# Patient Record
Sex: Female | Born: 1950 | Race: White | Hispanic: No | Smoking: Current every day smoker
Health system: Southern US, Community
[De-identification: ages and names within clinical notes are randomized; demographics above are authoritative.]

## PROBLEM LIST (undated history)

## (undated) DIAGNOSIS — I10 Essential (primary) hypertension: Secondary | ICD-10-CM

## (undated) DIAGNOSIS — K219 Gastro-esophageal reflux disease without esophagitis: Secondary | ICD-10-CM

## (undated) HISTORY — DX: Gastro-esophageal reflux disease without esophagitis: K21.9

## (undated) HISTORY — PX: EYE SURGERY: SHX253

## (undated) HISTORY — DX: Essential (primary) hypertension: I10

---

## 2015-02-01 ENCOUNTER — Ambulatory Visit: Payer: BLUE CROSS/BLUE SHIELD | Admitting: Diagnostic Neuroimaging

## 2015-03-08 ENCOUNTER — Encounter: Payer: Self-pay | Admitting: Physician Assistant

## 2015-03-08 ENCOUNTER — Ambulatory Visit (INDEPENDENT_AMBULATORY_CARE_PROVIDER_SITE_OTHER): Payer: BLUE CROSS/BLUE SHIELD | Admitting: Physician Assistant

## 2015-03-08 VITALS — BP 153/80 | HR 81 | Temp 98.2°F | Resp 16 | Ht 62.0 in | Wt 101.8 lb

## 2015-03-08 DIAGNOSIS — Z1211 Encounter for screening for malignant neoplasm of colon: Secondary | ICD-10-CM

## 2015-03-08 DIAGNOSIS — I10 Essential (primary) hypertension: Secondary | ICD-10-CM | POA: Diagnosis not present

## 2015-03-08 DIAGNOSIS — K2270 Barrett's esophagus without dysplasia: Secondary | ICD-10-CM | POA: Diagnosis not present

## 2015-03-08 DIAGNOSIS — Z1239 Encounter for other screening for malignant neoplasm of breast: Secondary | ICD-10-CM

## 2015-03-08 DIAGNOSIS — Z720 Tobacco use: Secondary | ICD-10-CM

## 2015-03-08 DIAGNOSIS — F17200 Nicotine dependence, unspecified, uncomplicated: Secondary | ICD-10-CM | POA: Insufficient documentation

## 2015-03-08 DIAGNOSIS — F4321 Adjustment disorder with depressed mood: Secondary | ICD-10-CM

## 2015-03-08 MED ORDER — OMEPRAZOLE 40 MG PO CPDR: 40.0000 mg | DELAYED_RELEASE_CAPSULE | Freq: Every day | ORAL | Status: AC

## 2015-03-08 NOTE — Patient Instructions (Signed)
Please come back to see me soon so we can finish the appointment.  We'll do a TDap vaccine, draw labs, order the shingles vaccine, and do an EKG. I've referred you for a mammogram, referred you to the GI doctor, and sent in for your Omeprazole.

## 2015-03-08 NOTE — Progress Notes (Signed)
Subjective:    Patient ID: Sheryl Garrett, female    DOB: [birth year], 64 y.o.   MRN: [id number]  Chief Complaint  Patient presents with  . Establish care   Patient Active Problem List   Diagnosis Date Noted  . Barrett esophagus 03/08/2015  . Tobacco use disorder 03/08/2015  . Essential hypertension 03/08/2015  . Grief 03/08/2015   Prior to Admission medications   Medication Sig Start Date End Date Taking? Authorizing Provider  ALPRAZolam Duanne Moron) 1 MG tablet Take 1 mg by mouth 4 (four) times daily as needed for anxiety.   Yes Historical Provider, MD  amLODipine-benazepril (LOTREL) 5-20 MG per capsule Take 1 capsule by mouth daily.   Yes Historical Provider, MD  sertraline (ZOLOFT) 100 MG tablet Take 100 mg by mouth daily.   Yes Historical Provider, MD  omeprazole (PRILOSEC) 40 MG capsule Take 1 capsule (40 mg total) by mouth daily. 03/08/15   Julieta Gutting, PA   Medications, allergies, past medical history, surgical history, family history, social history and problem list reviewed and updated.  [name]   Originally from [place]. Has 3 grown daughters in [place]. Youngest in jail for heroin possession. 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 He came down ill last year and within 3 months passed from cancer. She is moving to the [place] area as her sister in law is here. Pt has bought a house but is unsure if she will stay here permanently or not. Wants to go back to [place].   PMH:  Hx Barret esophagus. Was having yearly upper endoscopy with GI in [place]. Needs to establish care with GI physician here. Denies current dysphagia, odynophagia, wt loss. Had been on dexilant but insurance is not covering, since then has been taking otc nexium for past couple wks. Mentions she has been getting mild right sided cp episodes approx once week when lying down at night. These last mins and resolve on own. Similar to prior gerd  sx. No radiation. No assoc sob, palps. No exertional cp.  HTN: On Lotrel. Stable for yrs. Stopped taking this several wks ago as her bp had been running low on home cuff. She thinks the cuff is old and not working well though. Denies cp, sob, palps, presyncope, syncope, ha, vision changes.  Grief: Seeing psychiatrist in area. Zoloft qd. States her psych is thinking of increasing this to 1.5 qd. Takes xanax approx bid. Seeing therapist regularly. Goes to grief therapy once week.  Tobacco use: Smoke since teenager. Currently 3/4 ppd. No interest in quitting at this time.   Not seeing a dentist here yet. Does not exercise. Doesn't have much of an appetite since her husband passed.  Last mammo 2 yrs ago, due. Last pap 3 yrs ago. No hx abn paps. Pt declines pap testing today.   Due for tdap vaccine today. Most recent colonoscopy 2-3 yrs ago in [place]. Pt states has hx polyps and usually has scope every 3 yrs. She is due. Denies recent change bowel habits, painful defecation, abd pain.   She is retired from [employer] in [place]. 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   Review of Systems See HPI.     Objective:   Physical Exam  Constitutional: She is oriented to person, place, and time. She appears well-developed and well-nourished.  Non-toxic appearance. She does not have a sickly appearance.  She does not appear ill. No distress.  BP 153/80 mmHg  Pulse 81  Temp(Src) 98.2 F (36.8 C) (Oral)  Resp 16  Ht 5\' 2"  (1.575 m)  Wt 101 lb 12.8 oz (46.176 kg)  BMI 18.61 kg/m2  SpO2 96%   Neurological: She is alert and oriented to person, place, and time.  Psychiatric: She has a normal mood and affect. Her speech is normal and behavior is normal.      Assessment & Plan:   52 yof with above pmh presents here to establish care.   Barrett's esophagus - Plan: omeprazole (PRILOSEC) 40 MG capsule, Ambulatory referral to Gastroenterology --referred to GI, likely due for upper endoscopy --started on  prilosec 40 mg qd for barretts. Pt previously on dexilant but dealing with obtaining insurance coverage now --no alarm sx today  Special screening for malignant neoplasms, colon - Plan: Ambulatory referral to Gastroenterology --referred to GI --no alarm sx  Screening for breast cancer - Plan: MM DIGITAL SCREENING BILATERAL --referred for mammo  Essential hypertension --elevated today --pt states she has not taken her bp med past couple wks though she is not out --encouraged to restart med, pt agreeable  Grief --doing well  --continue seeing psychiatry, counseling, and grief therapy sessions  *We did not get to a CPE today as pt stated she had to go due to her dog being in the car. She is planning to contact the office in next couple days to set up return appt to see me. Will do cpe, order labs (cmp, cbc, lipids, tsh, d, b12, mag), ekg, order bone density scan with pts hx ppi use, give tdap vaccine all at that time.   Julieta Gutting, PA-C Physician Assistant-Certified [facility]  03/08/2015 5:45 PM   Greater than 30 minutes spent with patient, of which greater than 50% was spent discussing recent life changes she has had to go through including her grief over husband's passing.

## 2015-03-25 ENCOUNTER — Ambulatory Visit
Admission: RE | Admit: 2015-03-25 | Discharge: 2015-03-25 | Disposition: A | Discharge status: Home / Self Care | Payer: BLUE CROSS/BLUE SHIELD | Source: Ambulatory Visit | Attending: Physician Assistant | Admitting: Physician Assistant

## 2015-03-25 DIAGNOSIS — Z1239 Encounter for other screening for malignant neoplasm of breast: Secondary | ICD-10-CM

## 2015-03-26 ENCOUNTER — Other Ambulatory Visit: Payer: Self-pay | Admitting: Physician Assistant

## 2015-03-26 ENCOUNTER — Telehealth: Payer: Self-pay | Admitting: Physician Assistant

## 2015-03-26 DIAGNOSIS — R928 Other abnormal and inconclusive findings on diagnostic imaging of breast: Secondary | ICD-10-CM

## 2015-03-26 NOTE — Telephone Encounter (Signed)
Called and spoke with pt regarding calcifications on breast mammogram. Instructed pt that radiology recommended a diagnostic mammogram for further evaluation and that they will be in contact with her to schedule this. All questions answered. Will follow to assure that mammogram gets completed.

## 2015-03-30 ENCOUNTER — Other Ambulatory Visit: Payer: Self-pay | Admitting: Physician Assistant

## 2015-03-30 ENCOUNTER — Ambulatory Visit
Admission: RE | Admit: 2015-03-30 | Discharge: 2015-03-30 | Disposition: A | Discharge status: Home / Self Care | Payer: BLUE CROSS/BLUE SHIELD | Source: Ambulatory Visit | Attending: Physician Assistant | Admitting: Physician Assistant

## 2015-03-30 ENCOUNTER — Ambulatory Visit (INDEPENDENT_AMBULATORY_CARE_PROVIDER_SITE_OTHER): Payer: BLUE CROSS/BLUE SHIELD | Admitting: Physician Assistant

## 2015-03-30 ENCOUNTER — Encounter: Payer: Self-pay | Admitting: Physician Assistant

## 2015-03-30 VITALS — BP 116/86 | HR 68 | Temp 98.1°F | Resp 16 | Ht 61.5 in | Wt 102.0 lb

## 2015-03-30 DIAGNOSIS — Z130 Encounter for screening for diseases of the blood and blood-forming organs and certain disorders involving the immune mechanism: Secondary | ICD-10-CM

## 2015-03-30 DIAGNOSIS — Z23 Encounter for immunization: Secondary | ICD-10-CM

## 2015-03-30 DIAGNOSIS — Z1329 Encounter for screening for other suspected endocrine disorder: Secondary | ICD-10-CM

## 2015-03-30 DIAGNOSIS — Z5181 Encounter for therapeutic drug level monitoring: Secondary | ICD-10-CM

## 2015-03-30 DIAGNOSIS — Z13820 Encounter for screening for osteoporosis: Secondary | ICD-10-CM

## 2015-03-30 DIAGNOSIS — Z1389 Encounter for screening for other disorder: Secondary | ICD-10-CM | POA: Diagnosis not present

## 2015-03-30 DIAGNOSIS — R9431 Abnormal electrocardiogram [ECG] [EKG]: Secondary | ICD-10-CM | POA: Diagnosis not present

## 2015-03-30 DIAGNOSIS — Z13220 Encounter for screening for lipoid disorders: Secondary | ICD-10-CM | POA: Diagnosis not present

## 2015-03-30 DIAGNOSIS — Z0000 Encounter for general adult medical examination without abnormal findings: Secondary | ICD-10-CM

## 2015-03-30 DIAGNOSIS — J309 Allergic rhinitis, unspecified: Secondary | ICD-10-CM

## 2015-03-30 DIAGNOSIS — R928 Other abnormal and inconclusive findings on diagnostic imaging of breast: Secondary | ICD-10-CM

## 2015-03-30 LAB — COMPLETE METABOLIC PANEL WITH GFR
ALBUMIN: 4.3 g/dL (ref 3.5–5.2)
ALT: 17 U/L (ref 0–35)
AST: 21 U/L (ref 0–37)
Alkaline Phosphatase: 72 U/L (ref 39–117)
BUN: 7 mg/dL (ref 6–23)
CALCIUM: 9.4 mg/dL (ref 8.4–10.5)
CHLORIDE: 96 meq/L (ref 96–112)
CO2: 24 meq/L (ref 19–32)
Creat: 0.6 mg/dL (ref 0.50–1.10)
GFR, Est Non African American: >89 mL/min
GLUCOSE: 79 mg/dL (ref 70–99)
POTASSIUM: 4.3 meq/L (ref 3.5–5.3)
SODIUM: 133 meq/L — AB (ref 135–145)
Total Bilirubin: 0.5 mg/dL (ref 0.2–1.2)
Total Protein: 6.6 g/dL (ref 6.0–8.3)

## 2015-03-30 LAB — CBC
HEMATOCRIT: 40.7 % (ref 36.0–46.0)
Hemoglobin: 14.2 g/dL (ref 12.0–15.0)
MCH: 33.3 pg (ref 26.0–34.0)
MCHC: 34.9 g/dL (ref 30.0–36.0)
MCV: 95.3 fL (ref 78.0–100.0)
MPV: 8.6 fL (ref 8.6–12.4)
Platelets: 335 10*3/uL (ref 150–400)
RBC: 4.27 MIL/uL (ref 3.87–5.11)
RDW: 13.2 % (ref 11.5–15.5)
WBC: 8.6 10*3/uL (ref 4.0–10.5)

## 2015-03-30 LAB — MAGNESIUM: Magnesium: 2.2 mg/dL (ref 1.5–2.5)

## 2015-03-30 LAB — LIPID PANEL
Cholesterol: 195 mg/dL (ref 0–200)
HDL: 105 mg/dL (ref >=46)
LDL CALC: 79 mg/dL (ref 0–99)
Total CHOL/HDL Ratio: 1.9 Ratio
Triglycerides: 54 mg/dL (ref <150)
VLDL: 11 mg/dL (ref 0–40)

## 2015-03-30 LAB — VITAMIN B12: VITAMIN B 12: 307 pg/mL (ref 211–911)

## 2015-03-30 LAB — TSH: TSH: 1.064 u[IU]/mL (ref 0.350–4.500)

## 2015-03-30 MED ORDER — FLUTICASONE PROPIONATE 50 MCG/ACT NA SUSP: 2.0000 | Freq: Every day | NASAL | Status: DC

## 2015-03-30 MED ORDER — ZOSTER VACCINE LIVE 19400 UNT/0.65ML ~~LOC~~ SOLR: 0.6500 mL | Freq: Once | SUBCUTANEOUS | Status: DC

## 2015-03-30 NOTE — Progress Notes (Signed)
Subjective:    Patient ID: Sheryl Garrett, female    DOB: Jan 03, 1951, 64 y.o.   MRN: [id number]  Chief Complaint  Patient presents with  . Annual Exam  . Sinusitis   Patient Active Problem List   Diagnosis Date Noted  . Barrett esophagus 03/08/2015  . Tobacco use disorder 03/08/2015  . Essential hypertension 03/08/2015  . Grief 03/08/2015   Prior to Admission medications   Medication Sig Start Date End Date Taking? Authorizing Provider  ALPRAZolam Duanne Moron) 1 MG tablet Take 1 mg by mouth 4 (four) times daily as needed for anxiety.   Yes Historical Provider, MD  amLODipine-benazepril (LOTREL) 5-20 MG per capsule Take 1 capsule by mouth daily.   Yes Historical Provider, MD  omeprazole (PRILOSEC) 40 MG capsule Take 1 capsule (40 mg total) by mouth daily. 03/08/15  Yes Merlinda Frederick Mihail Prettyman, PA  sertraline (ZOLOFT) 100 MG tablet Take 100 mg by mouth daily.   Yes Historical Provider, MD  fluticasone (FLONASE) 50 MCG/ACT nasal spray Place 2 sprays into both nostrils daily. 03/30/15   Julieta Gutting, PA  zoster vaccine live, PF, (ZOSTAVAX) 40981 UNT/0.65ML injection Inject 19,400 Units into the skin once. 03/30/15   Julieta Gutting, PA   Medications, allergies, past medical history, surgical history, family history, social history and problem list reviewed and updated.  HPI  35 yof with above pmh presents for cpe.   She is new to the area and was seen 03/08/15 in clinic for initial eval. Was not able to get to the physical exam as spent significant time discussing social hx and current life situation along with pmh.   Today she presents for cpe. Much of pmh, social hx, surgical hx reviewed at 3/28 appt.   Vaccinations: Due for both tdap and pneumovax today as she is a smoker. Interested in shingles vaccine. She is not interested in quitting.  Pap: Last one 3 yrs ago, never had abn pap. She declines today.  Screenings: Due for bone density scan as she is on chronic ppi therapy for her barretts. EKG today  as we do not have any on record.  Dentist: Still has not established care. Planning to soon.   She had a concerning screening mammo last week. Had diagnostic mammo this am which was normal. Calcifications in right breast felt to be benign, repeat in 6 months.   She is scheduled to see GI on 04/09/15 for upper endoscopy for barretts and colonoscopy as she had polyps 3 yrs ago. Taking omeprazole daily.   She mentions itchy/watery eyes past few wks. Rhinorrhea past few wks. Took benadryl last night otherwise nothing.   Review of Systems Denies fevers, chills, cp, sob, palps, presyncope, syncope, abd pain, n/v, diarrhea. Denies dysuria, hematuria, blood in stool.     Objective:   Physical Exam  Constitutional: She is oriented to person, place, and time. She appears well-developed and well-nourished.  Non-toxic appearance. She does not have a sickly appearance. She does not appear ill. No distress.  BP 116/86 mmHg  Pulse 68  Temp(Src) 98.1 F (36.7 C) (Oral)  Resp 16  Ht 5' 1.5" (1.562 m)  Wt 102 lb (46.267 kg)  BMI 18.96 kg/m2  SpO2 97%   HENT:  Right Ear: Tympanic membrane normal.  Left Ear: Tympanic membrane normal.  Nose: Nose normal. Right sinus exhibits no maxillary sinus tenderness and no frontal sinus tenderness. Left sinus exhibits no maxillary sinus tenderness and no frontal sinus tenderness.  Mouth/Throat: Uvula is midline,  oropharynx is clear and moist and mucous membranes are normal.  Eyes: EOM are normal. Pupils are equal, round, and reactive to light. Right conjunctiva is injected. Left conjunctiva is not injected.  Neck: Trachea normal and normal range of motion. No JVD present. Carotid bruit is not present. No thyroid mass and no thyromegaly present.  Cardiovascular: Normal rate, regular rhythm and normal heart sounds.   Pulses:      Posterior tibial pulses are 2+ on the right side, and 2+ on the left side.  Pulmonary/Chest: Effort normal and breath sounds normal.    Abdominal: Soft. Normal appearance and bowel sounds are normal. There is no tenderness. There is no rigidity, no rebound, no guarding, no CVA tenderness, no tenderness at McBurney's point and negative Murphy's sign.  Musculoskeletal: Normal range of motion.  Lymphadenopathy:       Head (right side): No submental, no submandibular and no tonsillar adenopathy present.       Head (left side): No submental, no submandibular and no tonsillar adenopathy present.    She has no cervical adenopathy.  Neurological: She is alert and oriented to person, place, and time. She has normal strength. No cranial nerve deficit or sensory deficit. She displays a negative Romberg sign.  Psychiatric: She has a normal mood and affect. Her speech is normal and behavior is normal.   EKG read by Dr. Everlene Farrier. Findings: QS pattern V1, V2 with 1 mm horizontal ST elevation V2. Possible septal injury.      Assessment & Plan:   57 yof with above pmh presents for cpe.   Encounter for annual physical exam - Plan: EKG 12-Lead  Screening for deficiency anemia - Plan: CBC  Screening for hyperlipidemia - Plan: Lipid panel  Screening for thyroid disorder - Plan: TSH  Encounter for therapeutic drug monitoring - Plan: Vitamin D 1,25 dihydroxy, Vitamin B12, Magnesium --monitor with chronic ppi therapy  Screening for nephropathy - Plan: COMPLETE METABOLIC PANEL WITH GFR  Need for prophylactic vaccination against Streptococcus pneumoniae (pneumococcus) - Plan: Pneumococcal polysaccharide vaccine 23-valent greater than or equal to 2yo subcutaneous/IM --pneumovax today, prevnar at 65  Need for Tdap vaccination - Plan: Tdap vaccine greater than or equal to 7yo IM  Screening for osteoporosis - Plan: DG Bone Density  Need for shingles vaccine - Plan: zoster vaccine live, PF, (ZOSTAVAX) 35701 UNT/0.65ML injection  Allergic rhinitis, unspecified allergic rhinitis type - Plan: fluticasone (FLONASE) 50 MCG/ACT nasal  spray  Nonspecific abnormal electrocardiogram (ECG) (EKG) - Plan: Ambulatory referral to Cardiology --pt planning to contact old pcp to obtain ekgs so we can have for comparison --denies cardiac sx   Julieta Gutting, PA-C Physician Assistant-Certified [facility]  03/30/2015 6:11 PM

## 2015-03-30 NOTE — Patient Instructions (Signed)
We drew several labs today and I'll be in touch with you with those results.  We referred you for a bone density scan today.  You received the pneumonia and TDap vaccines today.  Your exam was normal today, please plan to get a pap smear sometime this year or next.  Plan to get the shingles vaccine if it is affordable.  I've referred you to cardiology for the EKG changes, they'll be in contact with you to schedule. Let us know if you get access to your old EKGs for comparison.  For your allergies using flonase daily and taking a daily antihistamine like claritin should help.

## 2015-04-04 LAB — VITAMIN D 1,25 DIHYDROXY
VITAMIN D3 1, 25 (OH): 65 pg/mL
Vitamin D 1, 25 (OH)2 Total: 65 pg/mL (ref 18–72)
Vitamin D2 1, 25 (OH)2: <8 pg/mL

## 2015-05-25 ENCOUNTER — Encounter: Payer: Self-pay | Admitting: Physician Assistant

## 2015-05-25 DIAGNOSIS — D126 Benign neoplasm of colon, unspecified: Secondary | ICD-10-CM | POA: Insufficient documentation

## 2015-05-25 DIAGNOSIS — K219 Gastro-esophageal reflux disease without esophagitis: Secondary | ICD-10-CM | POA: Insufficient documentation

## 2015-05-25 DIAGNOSIS — K5730 Diverticulosis of large intestine without perforation or abscess without bleeding: Secondary | ICD-10-CM | POA: Insufficient documentation

## 2015-06-15 ENCOUNTER — Ambulatory Visit (INDEPENDENT_AMBULATORY_CARE_PROVIDER_SITE_OTHER): Payer: BLUE CROSS/BLUE SHIELD

## 2015-06-15 ENCOUNTER — Ambulatory Visit (INDEPENDENT_AMBULATORY_CARE_PROVIDER_SITE_OTHER): Payer: BLUE CROSS/BLUE SHIELD | Admitting: Family Medicine

## 2015-06-15 VITALS — BP 110/70 | HR 79 | Temp 98.3°F | Resp 16 | Ht 61.5 in | Wt 103.0 lb

## 2015-06-15 DIAGNOSIS — S6992XA Unspecified injury of left wrist, hand and finger(s), initial encounter: Secondary | ICD-10-CM

## 2015-06-15 DIAGNOSIS — W19XXXA Unspecified fall, initial encounter: Secondary | ICD-10-CM

## 2015-06-15 DIAGNOSIS — Y92009 Unspecified place in unspecified non-institutional (private) residence as the place of occurrence of the external cause: Secondary | ICD-10-CM

## 2015-06-15 DIAGNOSIS — S52532A Colles' fracture of left radius, initial encounter for closed fracture: Secondary | ICD-10-CM

## 2015-06-15 MED ORDER — HYDROCODONE-ACETAMINOPHEN 5-325 MG PO TABS: 1.0000 | ORAL_TABLET | Freq: Four times a day (QID) | ORAL | Status: DC | PRN

## 2015-06-15 NOTE — Patient Instructions (Addendum)
Ice and elevated your wrist as much as possible.  You may take of the sling whenever you are being still resting at home and overnight but wear the sling whenever you are up and about and definitely whenever you go out of the house.  DO NOT REMOVE THE SPLINT AT ALL.  If the ace wrap gets really dirty you may change it but at this point I would encourage you not to - we can change it when we see you back and you should not remove the splint at all.  If it is rubbing a sore, if your fingers become swollen, if you have decreased sensation or fingers change color, return to clinic immediately for further evaluation. Recheck in 3-5 days so we can repeat xray and decide whether we want to leave you in the splint or transition to a short arm cast. Move your shoulder and your fingers as much as you can so they do not get stiff but do not move you wrist or elbow. Hopefully you will be taken out of the splint/cast around 4 weeks at the earliest and start physical therapy but may have to stay immobilized for up to 6 weeks if not healing well.  We will likely see you every 2 weeks - you may stop at checkout or call ahead to make sure the PA or myself that you want to see if working when you want to come in.  Hopefully you will be healed and back to normal in 8 weeks.  Colles Fracture A Colles fracture is a type of broken wrist. It means the radius bone is broken (fractured).  The radius is the bone of your forearm on the thumb side. The forearm is the part of your arm between the elbow and your wrist. Your forearm is made up of two bones. These are the radius and ulna. Often when the radius is broken, the ulna may also be broken. A cast or splint is used to protect and keep your injured bone from moving. The cast or splint will be on generally for about 5 to 6 weeks. SYMPTOMS  The usual problems are pain, swelling, and bruising. DIAGNOSIS  The diagnosis of this injury is usually made with X-rays. TREATMENT    Generally the fracture is held in place with a cast until it is healed. In a healthy person the casting lasts about 4 to 6 weeks but will depend on age and other factors. HOME CARE INSTRUCTIONS   Keep the injured part elevated while sitting or lying down. Keep the injury above the level of your heart (the center of the chest). This will decrease swelling and pain.  Apply ice to the injury for 15-20 minutes, 03-04 times per day while awake, for 2 days. Put the ice in a plastic bag and place a thin towel between the bag of ice and your cast or splint.  If you have a plaster or fiberglass cast:  Do not try to scratch the skin under the cast using sharp or pointed objects.  Check the skin around the cast every day. You may put lotion on any red or sore areas.  Keep your cast dry and clean.  If you have a plaster splint:  Wear the splint as directed.  You may loosen the elastic around the splint if your fingers become numb, tingle, or turn cold or blue.  Do not put pressure on any part of your cast or splint. It may break. Rest your cast only  on a pillow the first 24 hours until it is fully hardened.  Your cast or splint can be protected during bathing with a plastic bag. Do not lower the cast or splint into water.  Only take over-the-counter or prescription medicines for pain, discomfort, or fever as directed by your caregiver. SEEK IMMEDIATE MEDICAL CARE IF:   Your cast gets damaged or breaks.  You have more severe pain or swelling than you did before the cast.  Your skin or nails below the injury turn blue or gray, or feel cold or numb.  There is a bad smell or new stains and/or pus like (purulent) drainage coming from under the cast. MAKE SURE YOU:   Understand these instructions.  Will watch your condition.  Will get help right away if you are not doing well or get worse. Document Released: 12/13/2006 Document Revised: 02/19/2012 Document Reviewed: 01/08/2007 [facility]  Patient Information 2015 [place], [place]. This information is not intended to replace advice given to you by your health care provider. Make sure you discuss any questions you have with your health care provider.

## 2015-06-15 NOTE — Progress Notes (Signed)
Subjective:    Patient ID: Sheryl Garrett, female    DOB: 1951-04-08, 64 y.o.   MRN: [id number] This chart was scribed for Delman Cheadle, MD by Zola Button, Medical Scribe. This patient was seen in Room 9 and the patient's care was started at 10:22 AM.   Chief Complaint  Patient presents with  . Wrist Injury    Left wrist due to fall   HPI HPI Comments: Sheryl Garrett is a 64 y.o. female who presents to the [facility] complaining of sudden onset left wrist pain secondary to a fall on outstretched hand last night. Patient states she slipped on a slippery floor while wearing flip flops. She has also noticed some tingling in some of her fingers, primarily in her thumb. She has been applying ice and elevating. She has not applied a wrap nor tried any medications for the pain. Patient denies prior injuries to her wrist. She moved here less than a year ago and has not seen an orthopedist here.  Patient will be flying out tomorrow for a family reunion in [place] and [place]; she will be gone for 1 week.  Past Medical History  Diagnosis Date  . Hypertension   . GERD (gastroesophageal reflux disease)    History reviewed. No pertinent past surgical history. Current Outpatient Prescriptions on File Prior to Visit  Medication Sig Dispense Refill  . ALPRAZolam (XANAX) 1 MG tablet Take 1 mg by mouth 4 (four) times daily as needed for anxiety.    . fluticasone (FLONASE) 50 MCG/ACT nasal spray Place 2 sprays into both nostrils daily. 16 g 12  . omeprazole (PRILOSEC) 40 MG capsule Take 1 capsule (40 mg total) by mouth daily. 30 capsule 2  . sertraline (ZOLOFT) 100 MG tablet Take 100 mg by mouth daily.    Marland Kitchen amLODipine-benazepril (LOTREL) 5-20 MG per capsule Take 1 capsule by mouth daily.    Marland Kitchen zoster vaccine live, PF, (ZOSTAVAX) 28315 UNT/0.65ML injection Inject 19,400 Units into the skin once. (Patient not taking: Reported on 06/15/2015) 1 each 0   No current facility-administered medications  on file prior to visit.   No Known Allergies Family History  Problem Relation Age of Onset  . Drug abuse Daughter    History   Social History  . Marital Status: Widowed    Spouse Name: N/A  . Number of Children: N/A  . Years of Education: N/A   Social History Main Topics  . Smoking status: Current Every Day Smoker -- 1.00 packs/day  . Smokeless tobacco: Not on file  . Alcohol Use: No  . Drug Use: No  . Sexual Activity: No   Other Topics Concern  . None   Social History Narrative    Review of Systems  Constitutional: Positive for activity change. Negative for fever, chills and unexpected weight change.  Musculoskeletal: Positive for myalgias, joint swelling and arthralgias. Negative for back pain and gait problem.  Skin: Positive for color change. Negative for rash.  Neurological: Negative for weakness and numbness.       Tingling        Objective:  BP 110/70 mmHg  Pulse 79  Temp(Src) 98.3 F (36.8 C) (Oral)  Resp 16  Ht 5' 1.5" (1.562 m)  Wt 103 lb (46.72 kg)  BMI 19.15 kg/m2  SpO2 97%  Physical Exam  Constitutional: She is oriented to person, place, and time. She appears well-developed and well-nourished. No distress.  HENT:  Head: Normocephalic and atraumatic.  Mouth/Throat: Oropharynx is clear and moist. No oropharyngeal exudate.  Eyes: Pupils are equal, round, and reactive to light.  Neck: Neck supple.  Cardiovascular: Normal rate.   Pulses:      Radial pulses are 2+ on the right side, and 2+ on the left side.  2+ ulnar pulses. Normal cap refill.  Pulmonary/Chest: Effort normal.  Musculoskeletal: She exhibits no edema.  Supination and pronation severely limited. Full ROM of 2-5 MCPs; decreased 1st MCP. Severe swelling approximately 5 cm proximal and extending through radial aspect of volar aspect of left hand.  Neurological: She is alert and oriented to person, place, and time. No cranial nerve deficit.  Skin: Skin is warm and dry. No rash noted.    Psychiatric: She has a normal mood and affect. Her behavior is normal.  Vitals reviewed.  [facility] (PRIMARY) x-ray report read by Dr. Brigitte Pulse: Left wrist - Subtle buckle fracture on distal radius with avulsion of radial styloid only seen on lateral.  Dg Wrist Complete Left  06/15/2015   CLINICAL DATA:  Status post fall striking the left wrist last night, no previous injury  EXAM: LEFT WRIST - COMPLETE 3+ VIEW  COMPARISON:  None.  FINDINGS: The bones are mildly osteopenic. There is a nondisplaced impacted fracture of the distal left radial metaphysis. The adjacent ulna is intact. The carpal bones are intact. There is narrowing of the radiocarpal joint and of the first carpometacarpal joint. There is diffuse soft tissue swelling.  IMPRESSION: The patient has sustained a nondisplaced impacted fracture of the distal left radial metaphysis.   Electronically Signed   By: David  Martinique M.D.   On: 06/15/2015 10:53        Assessment & Plan:  Patient in a sugar-tong splint and a sling. Re-check in 3-5 days, but if she is doing well with minimal pain and increasing function, OK to defer follow-up after her return to [place] from [place] trip in 1 week. At which point, patient XR can be repeated and be placed in a short-term cast vs. continued splinting for healing. Will see every 2 weeks after and expect immobilization for 4-6 wks and resolution in 6-8 wks.. Encourage motion of the shoulder and fingers. CD given to patient in case patient has increasing pain and needs to see care in [place]. Try to follow-up in 3-5 days. Keep arm elevated and keep active ROM of shoulders and fingers.  1. Left wrist injury, initial encounter   2. Fall as cause of accidental injury in home as place of occurrence   3. Colles' fracture of left radius, closed, initial encounter     Orders Placed This Encounter  Procedures  . DG Wrist Complete Left    Standing Status: Future     Number of Occurrences: 1     Standing Expiration Date:  06/14/2016    Order Specific Question:  Reason for Exam (SYMPTOM  OR DIAGNOSIS REQUIRED)    Answer:  foosh yesterday, severe swelling and deformity over radial aspect    Order Specific Question:  Preferred imaging location?    Answer:  External    Meds ordered this encounter  Medications  . HYDROcodone-acetaminophen (NORCO/VICODIN) 5-325 MG per tablet    Sig: Take 1 tablet by mouth every 6 (six) hours as needed for moderate pain.    Dispense:  60 tablet    Refill:  0    I personally performed the services described in this documentation, which was scribed in my presence. The recorded information has  been reviewed and considered, and addended by me as needed.  Delman Cheadle, MD MPH

## 2015-06-27 ENCOUNTER — Ambulatory Visit (INDEPENDENT_AMBULATORY_CARE_PROVIDER_SITE_OTHER): Payer: BLUE CROSS/BLUE SHIELD | Admitting: Family Medicine

## 2015-06-27 ENCOUNTER — Ambulatory Visit (INDEPENDENT_AMBULATORY_CARE_PROVIDER_SITE_OTHER): Payer: BLUE CROSS/BLUE SHIELD

## 2015-06-27 VITALS — BP 130/80 | HR 62 | Temp 98.0°F | Ht 61.5 in | Wt 109.2 lb

## 2015-06-27 DIAGNOSIS — S52532D Colles' fracture of left radius, subsequent encounter for closed fracture with routine healing: Secondary | ICD-10-CM

## 2015-06-27 NOTE — Patient Instructions (Signed)
We will leave the cast on for 2-4 weeks (probably 4 but we will repeat the xray at your visit in 2 weeks to ensure it is still healing well and that the cast has kept it in alignment.  Stop using the sling.  Make sure you are rotating your shoulder as much as you can.  You will be healed in another 4 to 6 weeks so after your cast comes off we will probably put you in a prefabricated removable splint and refer you to physical therapy so that you can regain full movement in your wrist.  Colles Fracture A Colles fracture is a type of broken wrist. It means the radius bone is broken (fractured).  The radius is the bone of your forearm on the thumb side. The forearm is the part of your arm between the elbow and your wrist. Your forearm is made up of two bones. These are the radius and ulna. Often when the radius is broken, the ulna may also be broken. A cast or splint is used to protect and keep your injured bone from moving. The cast or splint will be on generally for about 5 to 6 weeks. SYMPTOMS  The usual problems are pain, swelling, and bruising. DIAGNOSIS  The diagnosis of this injury is usually made with X-rays. TREATMENT  Generally the fracture is held in place with a cast until it is healed. In a healthy person the casting lasts about 4 to 6 weeks but will depend on age and other factors. HOME CARE INSTRUCTIONS   Keep the injured part elevated while sitting or lying down. Keep the injury above the level of your heart (the center of the chest). This will decrease swelling and pain.  Apply ice to the injury for 15-20 minutes, 03-04 times per day while awake, for 2 days. Put the ice in a plastic bag and place a thin towel between the bag of ice and your cast or splint.  If you have a plaster or fiberglass cast:  Do not try to scratch the skin under the cast using sharp or pointed objects.  Check the skin around the cast every day. You may put lotion on any red or sore areas.  Keep your cast  dry and clean.  If you have a plaster splint:  Wear the splint as directed.  You may loosen the elastic around the splint if your fingers become numb, tingle, or turn cold or blue.  Do not put pressure on any part of your cast or splint. It may break. Rest your cast only on a pillow the first 24 hours until it is fully hardened.  Your cast or splint can be protected during bathing with a plastic bag. Do not lower the cast or splint into water.  Only take over-the-counter or prescription medicines for pain, discomfort, or fever as directed by your caregiver. SEEK IMMEDIATE MEDICAL CARE IF:   Your cast gets damaged or breaks.  You have more severe pain or swelling than you did before the cast.  Your skin or nails below the injury turn blue or gray, or feel cold or numb.  There is a bad smell or new stains and/or pus like (purulent) drainage coming from under the cast. MAKE SURE YOU:   Understand these instructions.  Will watch your condition.  Will get help right away if you are not doing well or get worse. Document Released: 12/13/2006 Document Revised: 02/19/2012 Document Reviewed: 01/08/2007 [facility] Patient Information 2015 [place], [place]. This information  is not intended to replace advice given to you by your health care provider. Make sure you discuss any questions you have with your health care provider.  

## 2015-06-27 NOTE — Progress Notes (Addendum)
Subjective:  This chart was scribed for Delman Cheadle, MD by Thea Alken, ED Scribe. This patient was seen in room 12 and the patient's care was started at 10:29 AM   Patient ID: Sheryl Garrett, female    DOB: 1951-12-11, 64 y.o.   MRN: [id number]  HPI   Chief Complaint  Patient presents with  . Follow-up    recheck left wrist from fall (repeat xray)   HPI Comments: Sheryl Garrett is a 64 y.o. female who presents to the [facility] for a follow up regarding an impacted fracture to left radius. Patient  [place] resulting in colles fracture on July 4th. She had planned to fly to [place] the following day. She was placed in sugar tong splint with sling and was advised to f/u in 3-5 days if she did not go on vacation. Encouraged patient movement of shoulder and finger as well as RICE.  Pt went on her trip to [place] but had difficulty getting around. Patient is still having pain to left wrist. She is tolerating Norco and has been taking 1 twice a day. No pain in shoulder and has been doing shoulder movements as advised previously.   Past Medical History  Diagnosis Date  . Hypertension   . GERD (gastroesophageal reflux disease)    No past surgical history on file. Prior to Admission medications   Medication Sig Start Date End Date Taking? Authorizing Provider  ALPRAZolam Duanne Moron) 1 MG tablet Take 1 mg by mouth 4 (four) times daily as needed for anxiety.   Yes Historical Provider, MD  amLODipine-benazepril (LOTREL) 5-20 MG per capsule Take 1 capsule by mouth daily.   Yes Historical Provider, MD  fluticasone (FLONASE) 50 MCG/ACT nasal spray Place 2 sprays into both nostrils daily. 03/30/15  Yes Todd McVeigh, PA  HYDROcodone-acetaminophen (NORCO/VICODIN) 5-325 MG per tablet Take 1 tablet by mouth every 6 (six) hours as needed for moderate pain. 06/15/15  Yes Shawnee Knapp, MD  omeprazole (PRILOSEC) 40 MG capsule Take 1 capsule (40 mg total) by mouth daily. 03/08/15  Yes Todd McVeigh, PA    sertraline (ZOLOFT) 100 MG tablet Take 100 mg by mouth daily.   Yes Historical Provider, MD  zoster vaccine live, PF, (ZOSTAVAX) 78938 UNT/0.65ML injection Inject 19,400 Units into the skin once. Patient not taking: Reported on 06/15/2015 03/30/15   Araceli Bouche, PA   Review of Systems  Constitutional: Positive for activity change. Negative for fever, chills, appetite change and unexpected weight change.  Cardiovascular: Negative for chest pain and palpitations.  Gastrointestinal: Negative for vomiting and abdominal pain.  Musculoskeletal: Positive for myalgias, joint swelling and arthralgias. Negative for gait problem.  Skin: Positive for color change. Negative for rash and wound.  Neurological: Negative for tremors, weakness and numbness.  Hematological: Bruises/bleeds easily.  Psychiatric/Behavioral: Positive for sleep disturbance.   Objective:   Physical Exam  Constitutional: She is oriented to person, place, and time. She appears well-developed and well-nourished. No distress.  HENT:  Head: Normocephalic and atraumatic.  Eyes: Conjunctivae and EOM are normal.  Neck: Neck supple.  Cardiovascular: Normal rate.   Pulmonary/Chest: Effort normal.  Musculoskeletal: Normal range of motion.  Moderate reduction in supination and pronation with severe reduction extension and flexion to about 30 degrees. No extension.  Full flexion of elbow. Elbow extension mildly reduced to about 30 degrees. Full grasp but painful with resistance. Hematoma and bruising over ulnar aspect of left arm.   Neurological: She is alert and  oriented to person, place, and time.  Skin: Skin is warm and dry.  Psychiatric: She has a normal mood and affect. Her behavior is normal.  Nursing note and vitals reviewed.  Filed Vitals:   06/27/15 0937  BP: 130/80  Pulse: 62  Temp: 98 F (36.7 C)  TempSrc: Oral  Height: 5' 1.5" (1.562 m)  Weight: 109 lb 4 oz (49.555 kg)  SpO2: 97%    [facility] reading (PRIMARY) by Dr. Brigitte Pulse.  Left wrist. left distal radial fracture healing with signs of callus formation.  Dg Forearm Left  06/27/2015   CLINICAL DATA:  Evaluate healing Colles fracture  EXAM: LEFT FOREARM - 2 VIEW  COMPARISON:  06/15/2015  FINDINGS: Healing distal radial fracture without intra-articular extension. Associated callus formation. Fracture lucency remains visible.  IMPRESSION: Healing distal radial fracture, as above. Fracture lucency remains visible.   Electronically Signed   By: Julian Hy M.D.   On: 06/27/2015 11:45   Dg Wrist Complete Left  06/15/2015   CLINICAL DATA:  Status post fall striking the left wrist last night, no previous injury  EXAM: LEFT WRIST - COMPLETE 3+ VIEW  COMPARISON:  None.  FINDINGS: The bones are mildly osteopenic. There is a nondisplaced impacted fracture of the distal left radial metaphysis. The adjacent ulna is intact. The carpal bones are intact. There is narrowing of the radiocarpal joint and of the first carpometacarpal joint. There is diffuse soft tissue swelling.  IMPRESSION: The patient has sustained a nondisplaced impacted fracture of the distal left radial metaphysis.   Electronically Signed   By: David  Martinique M.D.   On: 06/15/2015 10:53    Assessment & Plan:    1. Colles' fracture of left radius, closed, with routine healing, subsequent encounter   short arm cast applied today for 2-4 wks - expect 4 wks due to age and tob abuse.  Fast track recheck in 2 wks for repeat xray and can remove if large amount of callus formation and may want to cons transitioning pt into pre-fab cock-up wrist splint for pt to use at least during activity as usually will take 4-6 additional wks from today (2 wks in) for complete healing.  Stop sling, push shoulder and finger ROM exercises, start elbow ROM as tolerated. Will refer to PT for aggressive ROM/restrengthening after cast removed.  Orders Placed This Encounter  Procedures  . DG Forearm Left    Standing Status: Future     Number  of Occurrences: 1     Standing Expiration Date: 06/26/2016    Order Specific Question:  Reason for Exam (SYMPTOM  OR DIAGNOSIS REQUIRED)    Answer:  f/u radial impaction fracture from 7/4    Order Specific Question:  Preferred imaging location?    Answer:  External     I personally performed the services described in this documentation, which was scribed in my presence. The recorded information has been reviewed and considered, and addended by me as needed.  Delman Cheadle, MD MPH

## 2015-06-27 NOTE — Progress Notes (Signed)
Short arm cast applied.

## 2015-07-09 ENCOUNTER — Ambulatory Visit (INDEPENDENT_AMBULATORY_CARE_PROVIDER_SITE_OTHER): Payer: BLUE CROSS/BLUE SHIELD

## 2015-07-09 ENCOUNTER — Ambulatory Visit (INDEPENDENT_AMBULATORY_CARE_PROVIDER_SITE_OTHER): Payer: BLUE CROSS/BLUE SHIELD | Admitting: Emergency Medicine

## 2015-07-09 VITALS — BP 130/80 | HR 71 | Temp 98.0°F | Resp 16 | Ht 61.5 in | Wt 107.0 lb

## 2015-07-09 DIAGNOSIS — S52502D Unspecified fracture of the lower end of left radius, subsequent encounter for closed fracture with routine healing: Secondary | ICD-10-CM

## 2015-07-09 NOTE — Patient Instructions (Signed)
Radial Fracture °You have a broken bone (fracture) of the forearm. This is the part of your arm between the elbow and your wrist. Your forearm is made up of two bones. These are the radius and ulna. Your fracture is in the radial shaft. This is the bone in your forearm located on the thumb side. A cast or splint is used to protect and keep your injured bone from moving. The cast or splint will be on generally for about 5 to 6 weeks, with individual variations. °HOME CARE INSTRUCTIONS  °· Keep the injured part elevated while sitting or lying down. Keep the injury above the level of your heart (the center of the chest). This will decrease swelling and pain. °· Apply ice to the injury for 15-20 minutes, 03-04 times per day while awake, for 2 days. Put the ice in a plastic bag and place a towel between the bag of ice and your cast or splint. °· Move your fingers to avoid stiffness and minimize swelling. °· If you have a plaster or fiberglass cast: °¨ Do not try to scratch the skin under the cast using sharp or pointed objects. °¨ Check the skin around the cast every day. You may put lotion on any red or sore areas. °¨ Keep your cast dry and clean. °· If you have a plaster splint: °¨ Wear the splint as directed. °¨ You may loosen the elastic around the splint if your fingers become numb, tingle, or turn cold or blue. °¨ Do not put pressure on any part of your cast or splint. It may break. Rest your cast only on a pillow for the first 24 hours until it is fully hardened. °· Your cast or splint can be protected during bathing with a plastic bag. Do not lower the cast or splint into water. °· Only take over-the-counter or prescription medicines for pain, discomfort, or fever as directed by your caregiver. °SEEK IMMEDIATE MEDICAL CARE IF:  °· Your cast gets damaged or breaks. °· You have more severe pain or swelling than you did before getting the cast. °· You have severe pain when stretching your fingers. °· There is a bad  smell, new stains and/or pus-like (purulent) drainage coming from under the cast. °· Your fingers or hand turn pale or blue and become cold or your loose feeling. °Document Released: 05/10/2006 Document Revised: 02/19/2012 Document Reviewed: 08/06/2006 °[employer]® Patient Information ©2015 [employer]. This information is not intended to replace advice given to you by your health care provider. Make sure you discuss any questions you have with your health care provider. ° °

## 2015-07-09 NOTE — Progress Notes (Addendum)
Subjective:  Patient ID: Sheryl Garrett, female    DOB: Sep 05, 1951  Age: 64 y.o. MRN: [id number]  CC: Follow-up and Arm Injury   HPI Sheryl Garrett presents  for follow-up of fracture distal radius. She's been doing well in the interval history and has little pain. She does have some discomfort with flexion of the of the thumb she doesn't have an opposable grip right now. She was scheduled to come back in today for follow-up x-ray.  History Sheryl Garrett has a past medical history of Hypertension and GERD (gastroesophageal reflux disease).   She has no past surgical history on file.   Her  family history includes Drug abuse in her daughter.  She   reports that she has been smoking.  She does not have any smokeless tobacco history on file. She reports that she does not drink alcohol or use illicit drugs.  Outpatient Prescriptions Prior to Visit  Medication Sig Dispense Refill  . ALPRAZolam (XANAX) 1 MG tablet Take 1 mg by mouth 4 (four) times daily as needed for anxiety.    Marland Kitchen amLODipine-benazepril (LOTREL) 5-20 MG per capsule Take 1 capsule by mouth daily.    . fluticasone (FLONASE) 50 MCG/ACT nasal spray Place 2 sprays into both nostrils daily. 16 g 12  . HYDROcodone-acetaminophen (NORCO/VICODIN) 5-325 MG per tablet Take 1 tablet by mouth every 6 (six) hours as needed for moderate pain. 60 tablet 0  . omeprazole (PRILOSEC) 40 MG capsule Take 1 capsule (40 mg total) by mouth daily. 30 capsule 2  . sertraline (ZOLOFT) 100 MG tablet Take 100 mg by mouth daily.    Marland Kitchen zoster vaccine live, PF, (ZOSTAVAX) 28786 UNT/0.65ML injection Inject 19,400 Units into the skin once. 1 each 0   No facility-administered medications prior to visit.    History   Social History  . Marital Status: Widowed    Spouse Name: N/A  . Number of Children: N/A  . Years of Education: N/A   Social History Main Topics  . Smoking status: Current Every Day Smoker -- 1.00 packs/day  . Smokeless tobacco: Not on file  . Alcohol  Use: No  . Drug Use: No  . Sexual Activity: No   Other Topics Concern  . None   Social History Narrative     Review of Systems  Constitutional: Negative for fever, chills and appetite change.  HENT: Negative for congestion, ear pain, postnasal drip, sinus pressure and sore throat.   Eyes: Negative for pain and redness.  Respiratory: Negative for cough, shortness of breath and wheezing.   Cardiovascular: Negative for leg swelling.  Gastrointestinal: Negative for nausea, vomiting, abdominal pain, diarrhea, constipation and blood in stool.  Endocrine: Negative for polyuria.  Genitourinary: Negative for dysuria, urgency, frequency and flank pain.  Musculoskeletal: Negative for gait problem.  Skin: Negative for rash.  Neurological: Negative for weakness and headaches.  Psychiatric/Behavioral: Negative for confusion and decreased concentration. The patient is not nervous/anxious.     Objective:  BP 130/80 mmHg  Pulse 71  Temp(Src) 98 F (36.7 C)  Resp 16  Ht 5' 1.5" (1.562 m)  Wt 107 lb (48.535 kg)  BMI 19.89 kg/m2  Physical Exam  Constitutional: She is oriented to person, place, and time. She appears well-developed and well-nourished.  HENT:  Head: Normocephalic and atraumatic.  Eyes: Conjunctivae are normal. Pupils are equal, round, and reactive to light.  Pulmonary/Chest: Effort normal.  Musculoskeletal: She exhibits no edema.  Neurological: She is alert and oriented to person, place, and  time.  Skin: Skin is dry.  Psychiatric: She has a normal mood and affect. Her behavior is normal. Thought content normal.      Assessment & Plan:   Sheryl Garrett was seen today for follow-up and arm injury.  Diagnoses and all orders for this visit:  Distal radius fracture, left, closed, with routine healing, subsequent encounter Orders: -     DG Wrist Complete Left; Future   I am having Sheryl Garrett maintain her amLODipine-benazepril, sertraline, ALPRAZolam, omeprazole, zoster vaccine  live (PF), fluticasone, and HYDROcodone-acetaminophen.  No orders of the defined types were placed in this encounter.   The cast around the base of thumb was trimmed back to allow better flexion of the thumb. She had worn spot on her flexor thumb where the thumb was rubbing against cast   Appropriate red flag conditions were discussed with the patient as well as actions that should be taken.  Patient expressed his understanding.  Follow-up: Return in about 2 weeks (around 07/23/2015).  Sheryl Culver, MD    [facility] reading (PRIMARY) by  Dr. Ouida Garrett.  Healing fracture  Good position.

## 2015-07-22 ENCOUNTER — Ambulatory Visit (INDEPENDENT_AMBULATORY_CARE_PROVIDER_SITE_OTHER): Payer: BLUE CROSS/BLUE SHIELD | Admitting: Family Medicine

## 2015-07-22 ENCOUNTER — Ambulatory Visit (INDEPENDENT_AMBULATORY_CARE_PROVIDER_SITE_OTHER): Payer: BLUE CROSS/BLUE SHIELD

## 2015-07-22 VITALS — BP 104/74 | HR 63 | Temp 97.8°F | Resp 16 | Ht 62.0 in | Wt 106.6 lb

## 2015-07-22 DIAGNOSIS — S52532G Colles' fracture of left radius, subsequent encounter for closed fracture with delayed healing: Secondary | ICD-10-CM

## 2015-07-22 DIAGNOSIS — M25532 Pain in left wrist: Secondary | ICD-10-CM | POA: Diagnosis not present

## 2015-07-22 DIAGNOSIS — S46812A Strain of other muscles, fascia and tendons at shoulder and upper arm level, left arm, initial encounter: Secondary | ICD-10-CM | POA: Diagnosis not present

## 2015-07-22 MED ORDER — HYDROCODONE-ACETAMINOPHEN 5-325 MG PO TABS: 1.0000 | ORAL_TABLET | Freq: Four times a day (QID) | ORAL | Status: DC | PRN

## 2015-07-22 NOTE — Patient Instructions (Addendum)
We are going to leave you in the wrist splint until you are no longer having any pain in your wrist.  My hope is that at your next office visit in 2 weeks (where you can follow up with Colletta Maryland who you met today), that you are having significantly less/minimal pain and then we can take you out of the splint overnight and while resting, and just have you wear the splint while you are active and doing chores.  Listen to your body - if it hurts that means you shouldn't do it.  Your wrist is healing but it is just slower than you or I want but it is happening.  We just need to continue being patient and being nice to your wrist so that it does fully heal without chronic pain or restrictions.  Certainly physical therapy would be best but if that is not an option for you I want you to try some of the exercises at home but gently and slowly.  We want to get the most motion in your other joints, with the least amount of weight and discomfort to your other joints, while still protecting your wrist.  Try not to twist your forearm and use your right hand for doorknobs, jars, etc.  Colles Fracture A Colles fracture is a type of broken wrist. It means the radius bone is broken (fractured).  The radius is the bone of your forearm on the thumb side. The forearm is the part of your arm between the elbow and your wrist. Your forearm is made up of two bones. These are the radius and ulna. Often when the radius is broken, the ulna may also be broken. A cast or splint is used to protect and keep your injured bone from moving. The cast or splint will be on generally for about 5 to 6 weeks. SYMPTOMS  The usual problems are pain, swelling, and bruising. DIAGNOSIS  The diagnosis of this injury is usually made with X-rays. TREATMENT  Generally the fracture is held in place with a cast until it is healed. In a healthy person the casting lasts about 4 to 6 weeks but will depend on age and other factors. HOME CARE INSTRUCTIONS    Keep the injured part elevated while sitting or lying down. Keep the injury above the level of your heart (the center of the chest). This will decrease swelling and pain.  Apply ice to the injury for 15-20 minutes, 03-04 times per day while awake, for 2 days. Put the ice in a plastic bag and place a thin towel between the bag of ice and your cast or splint.  If you have a plaster or fiberglass cast:  Do not try to scratch the skin under the cast using sharp or pointed objects.  Check the skin around the cast every day. You may put lotion on any red or sore areas.  Keep your cast dry and clean.  If you have a plaster splint:  Wear the splint as directed.  You may loosen the elastic around the splint if your fingers become numb, tingle, or turn cold or blue.  Do not put pressure on any part of your cast or splint. It may break. Rest your cast only on a pillow the first 24 hours until it is fully hardened.  Your cast or splint can be protected during bathing with a plastic bag. Do not lower the cast or splint into water.  Only take over-the-counter or prescription medicines for pain, discomfort,  or fever as directed by your caregiver. SEEK IMMEDIATE MEDICAL CARE IF:   Your cast gets damaged or breaks.  You have more severe pain or swelling than you did before the cast.  Your skin or nails below the injury turn blue or gray, or feel cold or numb.  There is a bad smell or new stains and/or pus like (purulent) drainage coming from under the cast. MAKE SURE YOU:   Understand these instructions.  Will watch your condition.  Will get help right away if you are not doing well or get worse. Document Released: 12/13/2006 Document Revised: 02/19/2012 Document Reviewed: 01/08/2007 [facility] Patient Information 2015 [place], [place]. This information is not intended to replace advice given to you by your health care provider. Make sure you discuss any questions you have with your health  care provider.  Impingement Syndrome, Rotator Cuff, Bursitis with Rehab Impingement syndrome is a condition that involves inflammation of the tendons of the rotator cuff and the subacromial bursa, that causes pain in the shoulder. The rotator cuff consists of four tendons and muscles that control much of the shoulder and upper arm function. The subacromial bursa is a fluid filled sac that helps reduce friction between the rotator cuff and one of the bones of the shoulder (acromion). Impingement syndrome is usually an overuse injury that causes swelling of the bursa (bursitis), swelling of the tendon (tendonitis), and/or a tear of the tendon (strain). Strains are classified into three categories. Grade 1 strains cause pain, but the tendon is not lengthened. Grade 2 strains include a lengthened ligament, due to the ligament being stretched or partially ruptured. With grade 2 strains there is still function, although the function may be decreased. Grade 3 strains include a complete tear of the tendon or muscle, and function is usually impaired. SYMPTOMS   Pain around the shoulder, often at the outer portion of the upper arm.  Pain that gets worse with shoulder function, especially when reaching overhead or lifting.  Sometimes, aching when not using the arm.  Pain that wakes you up at night.  Sometimes, tenderness, swelling, warmth, or redness over the affected area.  Loss of strength.  Limited motion of the shoulder, especially reaching behind the back (to the back pocket or to unhook bra) or across your body.  Crackling sound (crepitation) when moving the arm.  Biceps tendon pain and inflammation (in the front of the shoulder). Worse when bending the elbow or lifting. CAUSES  Impingement syndrome is often an overuse injury, in which chronic (repetitive) motions cause the tendons or bursa to become inflamed. A strain occurs when a force is paced on the tendon or muscle that is greater than it  can withstand. Common mechanisms of injury include: Stress from sudden increase in duration, frequency, or intensity of training.  Direct hit (trauma) to the shoulder.  Aging, erosion of the tendon with normal use.  Bony bump on shoulder (acromial spur). RISK INCREASES WITH:  Contact sports (football, wrestling, boxing).  Throwing sports (baseball, tennis, volleyball).  Weightlifting and bodybuilding.  Heavy labor.  Previous injury to the rotator cuff, including impingement.  Poor shoulder strength and flexibility.  Failure to warm up properly before activity.  Inadequate protective equipment.  Old age.  Bony bump on shoulder (acromial spur). PREVENTION   Warm up and stretch properly before activity.  Allow for adequate recovery between workouts.  Maintain physical fitness:  Strength, flexibility, and endurance.  Cardiovascular fitness.  Learn and use proper exercise technique. PROGNOSIS  If treated properly, impingement syndrome usually goes away within 6 weeks. Sometimes surgery is required.  RELATED COMPLICATIONS   Longer healing time if not properly treated, or if not given enough time to heal.  Recurring symptoms, that result in a chronic condition.  Shoulder stiffness, frozen shoulder, or loss of motion.  Rotator cuff tendon tear.  Recurring symptoms, especially if activity is resumed too soon, with overuse, with a direct blow, or when using poor technique. TREATMENT  Treatment first involves the use of ice and medicine, to reduce pain and inflammation. The use of strengthening and stretching exercises may help reduce pain with activity. These exercises may be performed at home or with a therapist. If non-surgical treatment is unsuccessful after more than 6 months, surgery may be advised. After surgery and rehabilitation, activity is usually possible in 3 months.  MEDICATION  If pain medicine is needed, nonsteroidal anti-inflammatory medicines (aspirin  and ibuprofen), or other minor pain relievers (acetaminophen), are often advised.  Do not take pain medicine for 7 days before surgery.  Prescription pain relievers may be given, if your caregiver thinks they are needed. Use only as directed and only as much as you need.  Corticosteroid injections may be given by your caregiver. These injections should be reserved for the most serious cases, because they may only be given a certain number of times. HEAT AND COLD  Cold treatment (icing) should be applied for 10 to 15 minutes every 2 to 3 hours for inflammation and pain, and immediately after activity that aggravates your symptoms. Use ice packs or an ice massage.  Heat treatment may be used before performing stretching and strengthening activities prescribed by your caregiver, physical therapist, or athletic trainer. Use a heat pack or a warm water soak. SEEK MEDICAL CARE IF:   Symptoms get worse or do not improve in 4 to 6 weeks, despite treatment.  New, unexplained symptoms develop. (Drugs used in treatment may produce side effects.) EXERCISES  RANGE OF MOTION (ROM) AND STRETCHING EXERCISES - Impingement Syndrome (Rotator Cuff  Tendinitis, Bursitis) These exercises may help you when beginning to rehabilitate your injury. Your symptoms may go away with or without further involvement from your physician, physical therapist or athletic trainer. While completing these exercises, remember:   Restoring tissue flexibility helps normal motion to return to the joints. This allows healthier, less painful movement and activity.  An effective stretch should be held for at least 30 seconds.  A stretch should never be painful. You should only feel a gentle lengthening or release in the stretched tissue. STRETCH - Flexion, Standing  Stand with good posture. With an underhand grip on your right / left hand, and an overhand grip on the opposite hand, grasp a broomstick or cane so that your hands are a  little more than shoulder width apart.  Keeping your right / left elbow straight and shoulder muscles relaxed, push the stick with your opposite hand, to raise your right / left arm in front of your body and then overhead. Raise your arm until you feel a stretch in your right / left shoulder, but before you have increased shoulder pain.  Try to avoid shrugging your right / left shoulder as your arm rises, by keeping your shoulder blade tucked down and toward your mid-back spine. Hold for __________ seconds.  Slowly return to the starting position. Repeat __________ times. Complete this exercise __________ times per day. STRETCH - Abduction, Supine  Lie on your back. With an underhand grip  on your right / left hand and an overhand grip on the opposite hand, grasp a broomstick or cane so that your hands are a little more than shoulder width apart.  Keeping your right / left elbow straight and your shoulder muscles relaxed, push the stick with your opposite hand, to raise your right / left arm out to the side of your body and then overhead. Raise your arm until you feel a stretch in your right / left shoulder, but before you have increased shoulder pain.  Try to avoid shrugging your right / left shoulder as your arm rises, by keeping your shoulder blade tucked down and toward your mid-back spine. Hold for __________ seconds.  Slowly return to the starting position. Repeat __________ times. Complete this exercise __________ times per day. ROM - Flexion, Active-Assisted  Lie on your back. You may bend your knees for comfort.  Grasp a broomstick or cane so your hands are about shoulder width apart. Your right / left hand should grip the end of the stick, so that your hand is positioned "thumbs-up," as if you were about to shake hands.  Using your healthy arm to lead, raise your right / left arm overhead, until you feel a gentle stretch in your shoulder. Hold for __________ seconds.  Use the stick  to assist in returning your right / left arm to its starting position. Repeat __________ times. Complete this exercise __________ times per day.  ROM - Internal Rotation, Supine   Lie on your back on a firm surface. Place your right / left elbow about 60 degrees away from your side. Elevate your elbow with a folded towel, so that the elbow and shoulder are the same height.  Using a broomstick or cane and your strong arm, pull your right / left hand toward your body until you feel a gentle stretch, but no increase in your shoulder pain. Keep your shoulder and elbow in place throughout the exercise.  Hold for __________ seconds. Slowly return to the starting position. Repeat __________ times. Complete this exercise __________ times per day. STRETCH - Internal Rotation  Place your right / left hand behind your back, palm up.  Throw a towel or belt over your opposite shoulder. Grasp the towel with your right / left hand.  While keeping an upright posture, gently pull up on the towel, until you feel a stretch in the front of your right / left shoulder.  Avoid shrugging your right / left shoulder as your arm rises, by keeping your shoulder blade tucked down and toward your mid-back spine.  Hold for __________ seconds. Release the stretch, by lowering your healthy hand. Repeat __________ times. Complete this exercise __________ times per day. ROM - Internal Rotation   Using an underhand grip, grasp a stick behind your back with both hands.  While standing upright with good posture, slide the stick up your back until you feel a mild stretch in the front of your shoulder.  Hold for __________ seconds. Slowly return to your starting position. Repeat __________ times. Complete this exercise __________ times per day.  STRETCH - Posterior Shoulder Capsule   Stand or sit with good posture. Grasp your right / left elbow and draw it across your chest, keeping it at the same height as your  shoulder.  Pull your elbow, so your upper arm comes in closer to your chest. Pull until you feel a gentle stretch in the back of your shoulder.  Hold for __________ seconds. Repeat __________  times. Complete this exercise __________ times per day. STRENGTHENING EXERCISES - Impingement Syndrome (Rotator Cuff Tendinitis, Bursitis) These exercises may help you when beginning to rehabilitate your injury. They may resolve your symptoms with or without further involvement from your physician, physical therapist or athletic trainer. While completing these exercises, remember:  Muscles can gain both the endurance and the strength needed for everyday activities through controlled exercises.  Complete these exercises as instructed by your physician, physical therapist or athletic trainer. Increase the resistance and repetitions only as guided.  You may experience muscle soreness or fatigue, but the pain or discomfort you are trying to eliminate should never worsen during these exercises. If this pain does get worse, stop and make sure you are following the directions exactly. If the pain is still present after adjustments, discontinue the exercise until you can discuss the trouble with your clinician.  During your recovery, avoid activity or exercises which involve actions that place your injured hand or elbow above your head or behind your back or head. These positions stress the tissues which you are trying to heal. STRENGTH - Scapular Depression and Adduction   With good posture, sit on a firm chair. Support your arms in front of you, with pillows, arm rests, or on a table top. Have your elbows in line with the sides of your body.  Gently draw your shoulder blades down and toward your mid-back spine. Gradually increase the tension, without tensing the muscles along the top of your shoulders and the back of your neck.  Hold for __________ seconds. Slowly release the tension and relax your muscles  completely before starting the next repetition.  After you have practiced this exercise, remove the arm support and complete the exercise in standing as well as sitting position. Repeat __________ times. Complete this exercise __________ times per day.  STRENGTH - Shoulder Abductors, Isometric  With good posture, stand or sit about 4-6 inches from a wall, with your right / left side facing the wall.  Bend your right / left elbow. Gently press your right / left elbow into the wall. Increase the pressure gradually, until you are pressing as hard as you can, without shrugging your shoulder or increasing any shoulder discomfort.  Hold for __________ seconds.  Release the tension slowly. Relax your shoulder muscles completely before you begin the next repetition. Repeat __________ times. Complete this exercise __________ times per day.  STRENGTH - External Rotators, Isometric  Keep your right / left elbow at your side and bend it 90 degrees.  Step into a door frame so that the outside of your right / left wrist can press against the door frame without your upper arm leaving your side.  Gently press your right / left wrist into the door frame, as if you were trying to swing the back of your hand away from your stomach. Gradually increase the tension, until you are pressing as hard as you can, without shrugging your shoulder or increasing any shoulder discomfort.  Hold for __________ seconds.  Release the tension slowly. Relax your shoulder muscles completely before you begin the next repetition. Repeat __________ times. Complete this exercise __________ times per day.  STRENGTH - Supraspinatus   Stand or sit with good posture. Grasp a __________ weight, or an exercise band or tubing, so that your hand is "thumbs-up," like you are shaking hands.  Slowly lift your right / left arm in a "V" away from your thigh, diagonally into the space between your side and  straight ahead. Lift your hand to  shoulder height or as far as you can, without increasing any shoulder pain. At first, many people do not lift their hands above shoulder height.  Avoid shrugging your right / left shoulder as your arm rises, by keeping your shoulder blade tucked down and toward your mid-back spine.  Hold for __________ seconds. Control the descent of your hand, as you slowly return to your starting position. Repeat __________ times. Complete this exercise __________ times per day.  STRENGTH - External Rotators  Secure a rubber exercise band or tubing to a fixed object (table, pole) so that it is at the same height as your right / left elbow when you are standing or sitting on a firm surface.  Stand or sit so that the secured exercise band is at your uninjured side.  Bend your right / left elbow 90 degrees. Place a folded towel or small pillow under your right / left arm, so that your elbow is a few inches away from your side.  Keeping the tension on the exercise band, pull it away from your body, as if pivoting on your elbow. Be sure to keep your body steady, so that the movement is coming only from your rotating shoulder.  Hold for __________ seconds. Release the tension in a controlled manner, as you return to the starting position. Repeat __________ times. Complete this exercise __________ times per day.  STRENGTH - Internal Rotators   Secure a rubber exercise band or tubing to a fixed object (table, pole) so that it is at the same height as your right / left elbow when you are standing or sitting on a firm surface.  Stand or sit so that the secured exercise band is at your right / left side.  Bend your elbow 90 degrees. Place a folded towel or small pillow under your right / left arm so that your elbow is a few inches away from your side.  Keeping the tension on the exercise band, pull it across your body, toward your stomach. Be sure to keep your body steady, so that the movement is coming only from  your rotating shoulder.  Hold for __________ seconds. Release the tension in a controlled manner, as you return to the starting position. Repeat __________ times. Complete this exercise __________ times per day.  STRENGTH - Scapular Protractors, Standing   Stand arms length away from a wall. Place your hands on the wall, keeping your elbows straight.  Begin by dropping your shoulder blades down and toward your mid-back spine.  To strengthen your protractors, keep your shoulder blades down, but slide them forward on your rib cage. It will feel as if you are lifting the back of your rib cage away from the wall. This is a subtle motion and can be challenging to complete. Ask your caregiver for further instruction, if you are not sure you are doing the exercise correctly.  Hold for __________ seconds. Slowly return to the starting position, resting the muscles completely before starting the next repetition. Repeat __________ times. Complete this exercise __________ times per day. STRENGTH - Scapular Protractors, Supine  Lie on your back on a firm surface. Extend your right / left arm straight into the air while holding a __________ weight in your hand.  Keeping your head and back in place, lift your shoulder off the floor.  Hold for __________ seconds. Slowly return to the starting position, and allow your muscles to relax completely before starting the next  repetition. Repeat __________ times. Complete this exercise __________ times per day. STRENGTH - Scapular Protractors, Quadruped  Get onto your hands and knees, with your shoulders directly over your hands (or as close as you can be, comfortably).  Keeping your elbows locked, lift the back of your rib cage up into your shoulder blades, so your mid-back rounds out. Keep your neck muscles relaxed.  Hold this position for __________ seconds. Slowly return to the starting position and allow your muscles to relax completely before starting the  next repetition. Repeat __________ times. Complete this exercise __________ times per day.  STRENGTH - Scapular Retractors  Secure a rubber exercise band or tubing to a fixed object (table, pole), so that it is at the height of your shoulders when you are either standing, or sitting on a firm armless chair.  With a palm down grip, grasp an end of the band in each hand. Straighten your elbows and lift your hands straight in front of you, at shoulder height. Step back, away from the secured end of the band, until it becomes tense.  Squeezing your shoulder blades together, draw your elbows back toward your sides, as you bend them. Keep your upper arms lifted away from your body throughout the exercise.  Hold for __________ seconds. Slowly ease the tension on the band, as you reverse the directions and return to the starting position. Repeat __________ times. Complete this exercise __________ times per day. STRENGTH - Shoulder Extensors   Secure a rubber exercise band or tubing to a fixed object (table, pole) so that it is at the height of your shoulders when you are either standing, or sitting on a firm armless chair.  With a thumbs-up grip, grasp an end of the band in each hand. Straighten your elbows and lift your hands straight in front of you, at shoulder height. Step back, away from the secured end of the band, until it becomes tense.  Squeezing your shoulder blades together, pull your hands down to the sides of your thighs. Do not allow your hands to go behind you.  Hold for __________ seconds. Slowly ease the tension on the band, as you reverse the directions and return to the starting position. Repeat __________ times. Complete this exercise __________ times per day.  STRENGTH - Scapular Retractors and External Rotators   Secure a rubber exercise band or tubing to a fixed object (table, pole) so that it is at the height as your shoulders, when you are either standing, or sitting on a  firm armless chair.  With a palm down grip, grasp an end of the band in each hand. Bend your elbows 90 degrees and lift your elbows to shoulder height, at your sides. Step back, away from the secured end of the band, until it becomes tense.  Squeezing your shoulder blades together, rotate your shoulders so that your upper arms and elbows remain stationary, but your fists travel upward to head height.  Hold for __________ seconds. Slowly ease the tension on the band, as you reverse the directions and return to the starting position. Repeat __________ times. Complete this exercise __________ times per day.  STRENGTH - Scapular Retractors and External Rotators, Rowing   Secure a rubber exercise band or tubing to a fixed object (table, pole) so that it is at the height of your shoulders, when you are either standing, or sitting on a firm armless chair.  With a palm down grip, grasp an end of the band in each hand.  Straighten your elbows and lift your hands straight in front of you, at shoulder height. Step back, away from the secured end of the band, until it becomes tense.  Step 1: Squeeze your shoulder blades together. Bending your elbows, draw your hands to your chest, as if you are rowing a boat. At the end of this motion, your hands and elbow should be at shoulder height and your elbows should be out to your sides.  Step 2: Rotate your shoulders, to raise your hands above your head. Your forearms should be vertical and your upper arms should be horizontal.  Hold for __________ seconds. Slowly ease the tension on the band, as you reverse the directions and return to the starting position. Repeat __________ times. Complete this exercise __________ times per day.  STRENGTH - Scapular Depressors  Find a sturdy chair without wheels, such as a dining room chair.  Keeping your feet on the floor, and your hands on the chair arms, lift your bottom up from the seat, and lock your elbows.  Keeping  your elbows straight, allow gravity to pull your body weight down. Your shoulders will rise toward your ears.  Raise your body against gravity by drawing your shoulder blades down your back, shortening the distance between your shoulders and ears. Although your feet should always maintain contact with the floor, your feet should progressively support less body weight, as you get stronger.  Hold for __________ seconds. In a controlled and slow manner, lower your body weight to begin the next repetition. Repeat __________ times. Complete this exercise __________ times per day.  Document Released: 11/27/2005 Document Revised: 02/19/2012 Document Reviewed: 03/11/2009 [facility] Patient Information 2015 [place], [place]. This information is not intended to replace advice given to you by your health care provider. Make sure you discuss any questions you have with your health care provider.

## 2015-07-22 NOTE — Progress Notes (Addendum)
Subjective:  This chart was scribed for Delman Cheadle, MD by Leandra Kern, Medical Scribe. This patient was seen in Room 4 and the patient's care was started at 11:28 AM.   Patient ID: Sheryl Garrett, female    DOB: September 23, 1951, 64 y.o.   MRN: [id number]  Chief Complaint  Patient presents with  . Follow-up  . left wrist    fracture  . Medication Refill    Hydrocodone-Acetaminophen 5-325 mg   HPI HPI Comments: Sheryl Garrett is a 64 y.o. female who presents to [facility] for a follow up.  Pt is healing form left colles fracture, and she had a short arm cast placed on 7/17. She returned to clinic 2 weeks ago, saw Dr. Ouida Sills, cast was trimmed but left in place, X-rays showed no callus growth, bone was still showing resorption. We know pt will be a slow healer due to age and tobacco use. She is 6 weeks out form injury, first 2 weeks were in a sling and sugar tong splint, so she has been immobilized for 6 weeks now. Pt was encouraged to increase range of motion.   Today, pt notes that the area is still painful when rotating it. She reports that she is having trouble sleeping due to the pain. Pt states that she is out of her Hydrocodone-Acetaminophen medication, therefore she is requesting a refill for that. She denies taking any OTC medications for the pain. She reports that she lives alone and has nobody to help her with different activities that she needs.      Patient Active Problem List   Diagnosis Date Noted  . GERD (gastroesophageal reflux disease) 05/25/2015  . Colon adenomas 05/25/2015  . Diverticulosis of colon without hemorrhage 05/25/2015  . Barrett esophagus 03/08/2015  . Tobacco use disorder 03/08/2015  . Essential hypertension 03/08/2015  . Grief 03/08/2015   Past Medical History  Diagnosis Date  . Hypertension   . GERD (gastroesophageal reflux disease)    No past surgical history on file. No Known Allergies Prior to Admission medications   Medication Sig  Start Date End Date Taking? Authorizing Provider  ALPRAZolam Duanne Moron) 1 MG tablet Take 1 mg by mouth 4 (four) times daily as needed for anxiety.   Yes Historical Provider, MD  fluticasone (FLONASE) 50 MCG/ACT nasal spray Place 2 sprays into both nostrils daily. 03/30/15  Yes Todd McVeigh, PA  HYDROcodone-acetaminophen (NORCO/VICODIN) 5-325 MG per tablet Take 1 tablet by mouth every 6 (six) hours as needed for moderate pain. 06/15/15  Yes Shawnee Knapp, MD  omeprazole (PRILOSEC) 40 MG capsule Take 1 capsule (40 mg total) by mouth daily. 03/08/15  Yes Todd McVeigh, PA  sertraline (ZOLOFT) 100 MG tablet Take 100 mg by mouth daily.   Yes Historical Provider, MD  amLODipine-benazepril (LOTREL) 5-20 MG per capsule Take 1 capsule by mouth daily.    Historical Provider, MD  zoster vaccine live, PF, (ZOSTAVAX) 30076 UNT/0.65ML injection Inject 19,400 Units into the skin once. Patient not taking: Reported on 07/22/2015 03/30/15   Araceli Bouche, PA   Social History   Social History  . Marital Status: Widowed    Spouse Name: N/A  . Number of Children: N/A  . Years of Education: N/A   Occupational History  . Not on file.   Social History Main Topics  . Smoking status: Current Every Day Smoker -- 1.00 packs/day  . Smokeless tobacco: Not on file  . Alcohol Use: No  . Drug Use:  No  . Sexual Activity: No   Other Topics Concern  . Not on file   Social History Narrative    Review of Systems  Musculoskeletal: Positive for arthralgias.  Psychiatric/Behavioral: Positive for sleep disturbance.       Objective:   Physical Exam  Constitutional: She is oriented to person, place, and time. She appears well-developed and well-nourished. No distress.  HENT:  Head: Normocephalic and atraumatic.  Eyes: EOM are normal. Pupils are equal, round, and reactive to light.  Neck: Neck supple.  Cardiovascular: Normal rate.   Pulmonary/Chest: Effort normal.  Musculoskeletal:  Acromioclavicular joint is normal,  coracoid and Acromion is Normal.  Pain localizes to supraspinatus with rotation.  Left shoulder is significantly 2cm lower than the right.   Opposition strength is 5/5  4+/ 5 with internal and external rotation.    Neurological: She is alert and oriented to person, place, and time. No cranial nerve deficit.  Skin: Skin is warm and dry.  Psychiatric: She has a normal mood and affect. Her behavior is normal.  Nursing note and vitals reviewed.  BP 104/74 mmHg  Pulse 63  Temp(Src) 97.8 F (36.6 C) (Oral)  Resp 16  Ht 5\' 2"  (1.575 m)  Wt 106 lb 9.6 oz (48.353 kg)  BMI 19.49 kg/m2  SpO2 98%  [facility] (PRIMARY) x-ray report read by Dr. Delman Cheadle:  Left wrist- Callus formation seen at fracture sight.  Left shoulder- no acute abnormality, minimal degenerative change.   Cast was removed, she still had notable swelling.      Assessment & Plan:   1. Colles' fracture of left radius, closed, with delayed healing, subsequent encounter   2. Strain of left supraspinatus muscle, initial encounter   3. Wrist pain, acute, left   Fracture is healing normally it is just taking a little longer than the pt prefers.  Pt was seen initially the day after injury.  She was placed in a sugar tong splint with a sling with recommendations to stop sling after 3d.  Recommended ortho eval but pt left on vacation and did not pursue this when she was in the [place] visiting family as she was recommended to do.  She returned to clinic 2 wks later and was still having significant tenderness without any sign of healing on xray so short arm cast was placed which she had on x 4 wks as was removed today as we are now seeing the signs of healing on repeat xray.  Pt was transitioned to a prefabricated wrist splint which she is to wear continuously but it is much more comfortable for her.  Cont ROM exercises of shoulder.  Pt reminded to listen to her pain - if an action is causing pain in her wrist - don't do that action. RICE   Recheck in 2 wks - pt will be 2 mos out from injury at that time and my hope is we will be able to start removing the splint when at rest and start gentle ROM exercises.  Will likely need PT.  Will likely need ot repeat left wrist xray again in 2 wks due to pt's sig amount of pain and swelling she is still continuing to have 6 wks out.l  Orders Placed This Encounter  Procedures  . DG Wrist 2 Views Left    Standing Status: Future     Number of Occurrences: 1     Standing Expiration Date: 07/21/2016    Order Specific Question:  Reason for Exam (SYMPTOM  OR DIAGNOSIS REQUIRED)    Answer:  follow up colles fracture 6 weeks prior. casted for past 4 wks    Order Specific Question:  Preferred imaging location?    Answer:  External  . DG Shoulder Left    Standing Status: Future     Number of Occurrences: 1     Standing Expiration Date: 07/21/2016    Order Specific Question:  Reason for Exam (SYMPTOM  OR DIAGNOSIS REQUIRED)    Answer:  increased pain since in short arm cast x 1 mo following 2 wks of sugar tong and splint for left colles fracture    Order Specific Question:  Preferred imaging location?    Answer:  External    Meds ordered this encounter  Medications  . HYDROcodone-acetaminophen (NORCO/VICODIN) 5-325 MG per tablet    Sig: Take 1 tablet by mouth every 6 (six) hours as needed for moderate pain.    Dispense:  60 tablet    Refill:  0    I personally performed the services described in this documentation, which was scribed in my presence. The recorded information has been reviewed and considered, and addended by me as needed.  Delman Cheadle, MD MPH

## 2015-08-11 ENCOUNTER — Institutional Professional Consult (permissible substitution): Payer: BLUE CROSS/BLUE SHIELD | Admitting: Pulmonary Disease

## 2015-08-12 ENCOUNTER — Ambulatory Visit (INDEPENDENT_AMBULATORY_CARE_PROVIDER_SITE_OTHER): Payer: BLUE CROSS/BLUE SHIELD | Admitting: Family Medicine

## 2015-08-12 ENCOUNTER — Ambulatory Visit (INDEPENDENT_AMBULATORY_CARE_PROVIDER_SITE_OTHER): Payer: BLUE CROSS/BLUE SHIELD

## 2015-08-12 ENCOUNTER — Encounter: Payer: Self-pay | Admitting: Family Medicine

## 2015-08-12 VITALS — BP 157/76 | HR 69 | Temp 98.0°F | Resp 16 | Ht 62.0 in | Wt 106.0 lb

## 2015-08-12 DIAGNOSIS — S52532G Colles' fracture of left radius, subsequent encounter for closed fracture with delayed healing: Secondary | ICD-10-CM

## 2015-08-12 DIAGNOSIS — M7502 Adhesive capsulitis of left shoulder: Secondary | ICD-10-CM | POA: Diagnosis not present

## 2015-08-12 MED ORDER — HYDROCODONE-ACETAMINOPHEN 5-325 MG PO TABS: 1.0000 | ORAL_TABLET | Freq: Four times a day (QID) | ORAL | Status: DC | PRN

## 2015-08-12 NOTE — Progress Notes (Signed)
Subjective:    Patient ID: Sheryl Garrett, female    DOB: Jul 09, 1951, 64 y.o.   MRN: [id number]  Chief Complaint  Patient presents with  . Follow-up  . Wrist Injury    HPI  Pt is 8 wks out from injury. Initially placed in sugar tong and sling x 2 wks, did not followup with ortho so then pt placed in short arm cast x 1 mo which was removed 2 wks ago when xray began to show signs of healting.  Placed in prefab splint at all times,  Using prn hydrocodone.  Taking a vitamin B 12 supp - level borderline low when checked 4 mos prior.  Past Medical History  Diagnosis Date  . Hypertension   . GERD (gastroesophageal reflux disease)    Current Outpatient Prescriptions on File Prior to Visit  Medication Sig Dispense Refill  . ALPRAZolam (XANAX) 1 MG tablet Take 1 mg by mouth 4 (four) times daily as needed for anxiety.    Marland Kitchen amLODipine-benazepril (LOTREL) 5-20 MG per capsule Take 1 capsule by mouth daily.    . fluticasone (FLONASE) 50 MCG/ACT nasal spray Place 2 sprays into both nostrils daily. 16 g 12  . omeprazole (PRILOSEC) 40 MG capsule Take 1 capsule (40 mg total) by mouth daily. 30 capsule 2  . sertraline (ZOLOFT) 100 MG tablet Take 100 mg by mouth daily.    Marland Kitchen zoster vaccine live, PF, (ZOSTAVAX) 64680 UNT/0.65ML injection Inject 19,400 Units into the skin once. (Patient not taking: Reported on 07/22/2015) 1 each 0   No current facility-administered medications on file prior to visit.   No Known Allergies  Depression screen [facility] 2/9 06/15/2015 03/08/2015  Decreased Interest 2 0  Down, Depressed, Hopeless 2 0  PHQ - 2 Score 4 0  Altered sleeping 2 -  Tired, decreased energy 2 -  Change in appetite 2 -  Feeling bad or failure about yourself  2 -  Trouble concentrating 2 -  Moving slowly or fidgety/restless 2 -  Suicidal thoughts 0 -  PHQ-9 Score 16 -  Difficult doing work/chores Somewhat difficult -     Review of Systems  Constitutional: Positive for activity change, appetite change  and fatigue. Negative for fever, chills and unexpected weight change.  Musculoskeletal: Positive for myalgias, joint swelling, arthralgias and neck stiffness. Negative for gait problem and neck pain.  Skin: Negative for color change and rash.  Neurological: Negative for weakness and numbness.  Hematological: Bruises/bleeds easily.  Psychiatric/Behavioral: Positive for sleep disturbance, dysphoric mood, decreased concentration and agitation. Negative for suicidal ideas and self-injury.       Objective:   Physical Exam  Constitutional: She is oriented to person, place, and time. She appears well-developed and well-nourished. No distress.  HENT:  Head: Normocephalic and atraumatic.  Right Ear: External ear normal.  Eyes: Conjunctivae are normal. No scleral icterus.  Pulmonary/Chest: Effort normal.  Musculoskeletal:       Left shoulder: She exhibits decreased range of motion, pain and spasm. She exhibits no tenderness, no bony tenderness, no swelling, no effusion, no crepitus, no deformity and normal pulse.       Left elbow: She exhibits decreased range of motion. She exhibits no swelling. No tenderness found.       Left wrist: She exhibits decreased range of motion, tenderness, bony tenderness, swelling and deformity.  Neurological: She is alert and oriented to person, place, and time.  Skin: Skin is warm and dry. She is not diaphoretic. No erythema.  Psychiatric:  Her behavior is normal. Her affect is angry. She exhibits a depressed mood.    [facility] reading (PRIMARY) by  Dr. Brigitte Pulse. Left wrist: Showing some callus formation. No sig change from prior.  Dg Wrist 2 Views Left  08/12/2015   CLINICAL DATA:  Follow-up of collies fracture  EXAM: LEFT WRIST - 2 VIEW  COMPARISON:  In plaster views of July 22, 2015.  FINDINGS: The cast is been removed. There is evidence of healing of the impacted fracture of the distal left radial metaphysis. There is sclerosis surrounding the fracture line. The  fracture line remains faintly visible however. The adjacent ulna exhibits no significant abnormality. There is mild diffuse soft tissue swelling. There are degenerative change of the first carpometacarpal joint.  IMPRESSION: There is ongoing but as yet incomplete healing of the transversely oriented fracture through the distal left radial metaphysis.   Electronically Signed   By: David  Martinique M.D.   On: 08/12/2015 13:00   Dg Wrist 2 Views Left  07/22/2015   CLINICAL DATA:  Followup Colles fracture from 6 weeks prior  EXAM: LEFT WRIST - 2 VIEW  COMPARISON:  Left wrist films of 07/09/2015  FINDINGS: There is slightly more sclerosis along the fracture line through the transverse aspect of the distal left radius indicating some interval healing. No displacement is seen. The radiocarpal joint space is unremarkable and the carpal bones remain in normal position. The left forearm and wrist are in a splint.  IMPRESSION: Some interval healing of the transverse fracture of the distal left radius in splint.   Electronically Signed   By: Ivar Drape M.D.   On: 07/22/2015 12:07   Dg Shoulder Left  07/22/2015   CLINICAL DATA:  Increased pain, history of recent left wrist fracture  EXAM: LEFT SHOULDER - 2+ VIEW  COMPARISON:  None.  FINDINGS: The left humeral head is in normal position and the glenohumeral joint space is unremarkable. The left [facility] joint is normally aligned. No acute abnormality is seen.  IMPRESSION: Negative.   Electronically Signed   By: Ivar Drape M.D.   On: 07/22/2015 12:08       Assessment & Plan:   1. Colles' fracture of left radius, closed, with delayed healing, subsequent encounter   2. Frozen shoulder, left   Refer to PT for both shoulder and wrist.  Still healing - delayed secondary to smoking, petite, caucasion.  Needs dexa scan - has order in but pt has not scheduled. Cont wearing the prefab splint but ok to start removing when awake and sitting resting her hands and when she showers. She  can start doing some gentle ROM exercises.  Ok to wean off splint as guided by PT. RTC if pain/swelling worsens.  Orders Placed This Encounter  Procedures  . DG Wrist 2 Views Left    Standing Status: Future     Number of Occurrences: 1     Standing Expiration Date: 08/11/2016    Order Specific Question:  Reason for Exam (SYMPTOM  OR DIAGNOSIS REQUIRED)    Answer:  f/u colles fracture    Order Specific Question:  Preferred imaging location?    Answer:  External  . Ambulatory referral to Physical Therapy    Referral Priority:  Routine    Referral Type:  Physical Medicine    Referral Reason:  Specialty Services Required    Requested Specialty:  Physical Therapy    Number of Visits Requested:  1    Meds ordered this encounter  Medications  . HYDROcodone-acetaminophen (NORCO/VICODIN) 5-325 MG per tablet    Sig: Take 1 tablet by mouth every 6 (six) hours as needed for moderate pain.    Dispense:  60 tablet    Refill:  0    Delman Cheadle, MD MPH

## 2015-10-08 ENCOUNTER — Ambulatory Visit (INDEPENDENT_AMBULATORY_CARE_PROVIDER_SITE_OTHER): Payer: BLUE CROSS/BLUE SHIELD | Admitting: Family Medicine

## 2015-10-08 VITALS — BP 116/70 | HR 84 | Temp 98.0°F | Resp 17 | Ht 61.5 in | Wt 104.0 lb

## 2015-10-08 DIAGNOSIS — R0981 Nasal congestion: Secondary | ICD-10-CM | POA: Diagnosis not present

## 2015-10-08 DIAGNOSIS — J029 Acute pharyngitis, unspecified: Secondary | ICD-10-CM | POA: Diagnosis not present

## 2015-10-08 MED ORDER — GUAIFENESIN ER 1200 MG PO TB12: 1.0000 | ORAL_TABLET | Freq: Two times a day (BID) | ORAL | Status: DC | PRN

## 2015-10-08 MED ORDER — AMOXICILLIN 875 MG PO TABS: 875.0000 mg | ORAL_TABLET | Freq: Two times a day (BID) | ORAL | Status: DC

## 2015-10-08 MED ORDER — PSEUDOEPHEDRINE HCL ER 120 MG PO TB12: 120.0000 mg | ORAL_TABLET | Freq: Every day | ORAL | Status: DC

## 2015-10-08 MED ORDER — IPRATROPIUM BROMIDE 0.03 % NA SOLN: 2.0000 | Freq: Four times a day (QID) | NASAL | Status: DC

## 2015-10-08 NOTE — Progress Notes (Signed)
Subjective:  This chart was scribed for Sheryl Cheadle, MD by Thea Alken, ED Scribe. This patient was seen in room 10 and the patient's care was started at 8:44 AM.  Patient ID: Sheryl Garrett, female    DOB: 1951-03-12, 64 y.o.   MRN: [id number]  HPI   Chief Complaint  Patient presents with  . Sore Throat  . Otalgia   HPI Comments: Sheryl Garrett is a 64 y.o. female who presents to the [facility] complaining of sore throat and otalgia for 3 days. She has associated sinus pressure, nasal congestion, postnasal drip and hoarseness. She has not treated symptoms. Pt has flonase at home but only uses this once in a while. She will be traveling to [place] within the next week, her daughter is hospitalized for pneumonia.  Denies fever, chills and cough.   Past Medical History  Diagnosis Date  . Hypertension   . GERD (gastroesophageal reflux disease)    No Known Allergies Prior to Admission medications   Medication Sig Start Date End Date Taking? Authorizing Provider  ALPRAZolam Duanne Moron) 1 MG tablet Take 1 mg by mouth 4 (four) times daily as needed for anxiety.   Yes Historical Provider, MD  fluticasone (FLONASE) 50 MCG/ACT nasal spray Place 2 sprays into both nostrils daily. 03/30/15  Yes Todd McVeigh, PA  HYDROcodone-acetaminophen (NORCO/VICODIN) 5-325 MG per tablet Take 1 tablet by mouth every 6 (six) hours as needed for moderate pain. 08/12/15  Yes Shawnee Knapp, MD  omeprazole (PRILOSEC) 40 MG capsule Take 1 capsule (40 mg total) by mouth daily. 03/08/15  Yes Todd McVeigh, PA  sertraline (ZOLOFT) 100 MG tablet Take 100 mg by mouth daily.   Yes Historical Provider, MD  amLODipine-benazepril (LOTREL) 5-20 MG per capsule Take 1 capsule by mouth daily.    Historical Provider, MD  zoster vaccine live, PF, (ZOSTAVAX) 37342 UNT/0.65ML injection Inject 19,400 Units into the skin once. Patient not taking: Reported on 07/22/2015 03/30/15   Araceli Bouche, PA   Review of Systems  Constitutional:  Positive for fatigue. Negative for fever and chills.  HENT: Positive for congestion, ear pain, postnasal drip, sinus pressure, sore throat and trouble swallowing.   Respiratory: Negative for cough and chest tightness.    Objective:   Physical Exam  Constitutional: She appears well-developed and well-nourished. No distress.  HENT:  Head: Normocephalic.  Right Ear: External ear normal. Tympanic membrane is not injected. No middle ear effusion.  Left Ear: External ear normal. Tympanic membrane is not injected.  No middle ear effusion.  Nose: Rhinorrhea present.  Mouth/Throat: Posterior oropharyngeal erythema present. No oropharyngeal exudate or posterior oropharyngeal edema.  Eyes: Conjunctivae are normal.  Neck: Neck supple.  Cardiovascular: Normal rate, regular rhythm, S1 normal, S2 normal and normal heart sounds.  Exam reveals no gallop and no friction rub.   No murmur heard. Pulmonary/Chest: Effort normal and breath sounds normal. She has no wheezes. She has no rales.  Lymphadenopathy:       Head (right side): No tonsillar, no preauricular and no posterior auricular adenopathy present.       Head (left side): No tonsillar, no preauricular and no posterior auricular adenopathy present.    She has cervical adenopathy ( anterior on left).       Right cervical: No superficial cervical adenopathy present.      Left cervical: No superficial cervical adenopathy present.  Neurological: She is alert.  Psychiatric: She has a normal mood and affect. Her behavior  is normal.   Filed Vitals:   10/08/15 0811  BP: 116/70  Pulse: 84  Temp: 98 F (36.7 C)  TempSrc: Oral  Resp: 17  Height: 5' 1.5" (1.562 m)  Weight: 104 lb (47.174 kg)  SpO2: 98%   Assessment & Plan:   1. Sinus congestion   2. Acute pharyngitis, unspecified etiology   Pt reassured that suspect viral so start symptomatic care. However, as pt is traveling out of state next wk and will be around hospitalized sister and ill  father would be best to start emperic antibiotic trx of amox if sxs do not start to resolve in the next few d with symptomatic care alone. Snap rx given Meds ordered this encounter  Medications  . Guaifenesin (MUCINEX MAXIMUM STRENGTH) 1200 MG TB12    Sig: Take 1 tablet (1,200 mg total) by mouth every 12 (twelve) hours as needed.    Dispense:  14 tablet    Refill:  1  . pseudoephedrine (SUDAFED 12 HOUR) 120 MG 12 hr tablet    Sig: Take 1 tablet (120 mg total) by mouth daily.    Dispense:  30 tablet    Refill:  0  . ipratropium (ATROVENT) 0.03 % nasal spray    Sig: Place 2 sprays into the nose 4 (four) times daily.    Dispense:  30 mL    Refill:  1    By signing my name below, I, [name], attest that this documentation has been prepared under the direction and in the presence of Sheryl Cheadle, MD.  Electronically Signed: Thea Alken, ED Scribe. 10/08/2015. 9:00 AM.  I personally performed the services described in this documentation, which was scribed in my presence. The recorded information has been reviewed and considered, and addended by me as needed.  Sheryl Cheadle, MD MPH

## 2015-10-08 NOTE — Patient Instructions (Signed)
Start your Flonase tonight.  Continue this for 2 weeks.  Use the sudafed in the morning along with twice a day mucinex.  Use the atrovent nasal spray every 4 hours. If in 2-3d you are not starting to feel better and getting fevers/chills, blowing out blood or dark mucous, or having sinus pain/pressure over one particular spot then go ahead and start the amoxicillin.  If you are not feeling better by early next week, make sure you come back. Hay Fever Hay fever is an allergic reaction to particles in the air. It cannot be passed from person to person. It cannot be cured, but it can be controlled. CAUSES  Hay fever is caused by something that triggers an allergic reaction (allergens). The following are examples of allergens:  Ragweed.  Feathers.  Animal dander.  Grass and tree pollens.  Cigarette smoke.  House dust.  Pollution. SYMPTOMS   Sneezing.  Runny or stuffy nose.  Tearing eyes.  Itchy eyes, nose, mouth, throat, skin, or other area.  Sore throat.  Headache.  Decreased sense of smell or taste. DIAGNOSIS Your caregiver will perform a physical exam and ask questions about the symptoms you are having.Allergy testing may be done to determine exactly what triggers your hay fever.  TREATMENT   Over-the-counter medicines may help symptoms. These include:  Antihistamines.  Decongestants. These may help with nasal congestion.  Your caregiver may prescribe medicines if over-the-counter medicines do not work.  Some people benefit from allergy shots when other medicines are not helpful. HOME CARE INSTRUCTIONS   Avoid the allergen that is causing your symptoms, if possible.  Take all medicine as told by your caregiver. SEEK MEDICAL CARE IF:   You have severe allergy symptoms and your current medicines are not helping.  Your treatment was working at one time, but you are now experiencing symptoms.  You have sinus congestion and pressure.  You develop a fever or  headache.  You have thick nasal discharge.  You have asthma and have a worsening cough and wheezing. SEEK IMMEDIATE MEDICAL CARE IF:   You have swelling of your tongue or lips.  You have trouble breathing.  You feel lightheaded or like you are going to faint.  You have cold sweats.  You have a fever.   This information is not intended to replace advice given to you by your health care provider. Make sure you discuss any questions you have with your health care provider.   Document Released: 11/27/2005 Document Revised: 02/19/2012 Document Reviewed: 06/09/2015 Elsevier Interactive Patient Education [employer].

## 2015-11-03 IMAGING — CR DG WRIST COMPLETE 3+V*L*
3 series · 3 of 3 positions shown · non-contrast
Comparison: 06/27/2015

CLINICAL DATA: Fracture distal radius left, close, routine healing.
Subsequent encounter.

EXAM:
LEFT WRIST - COMPLETE 3+ VIEW

[PA]
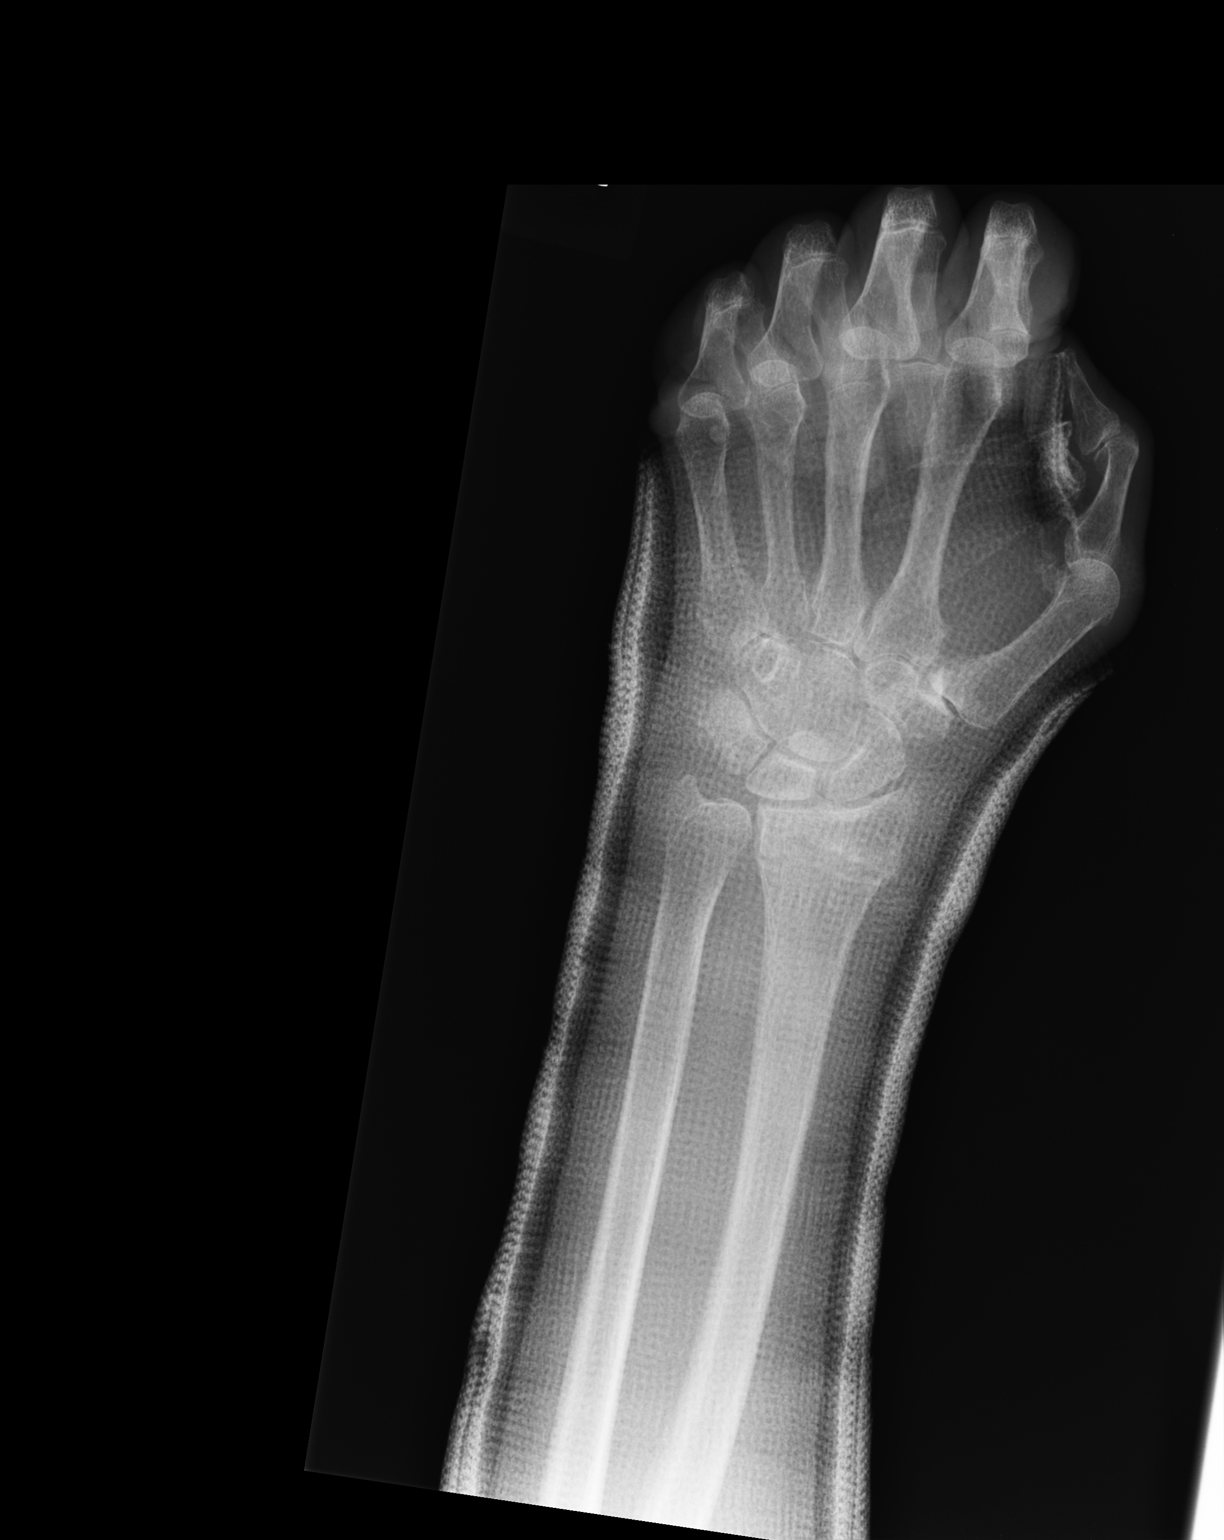

[lateral]
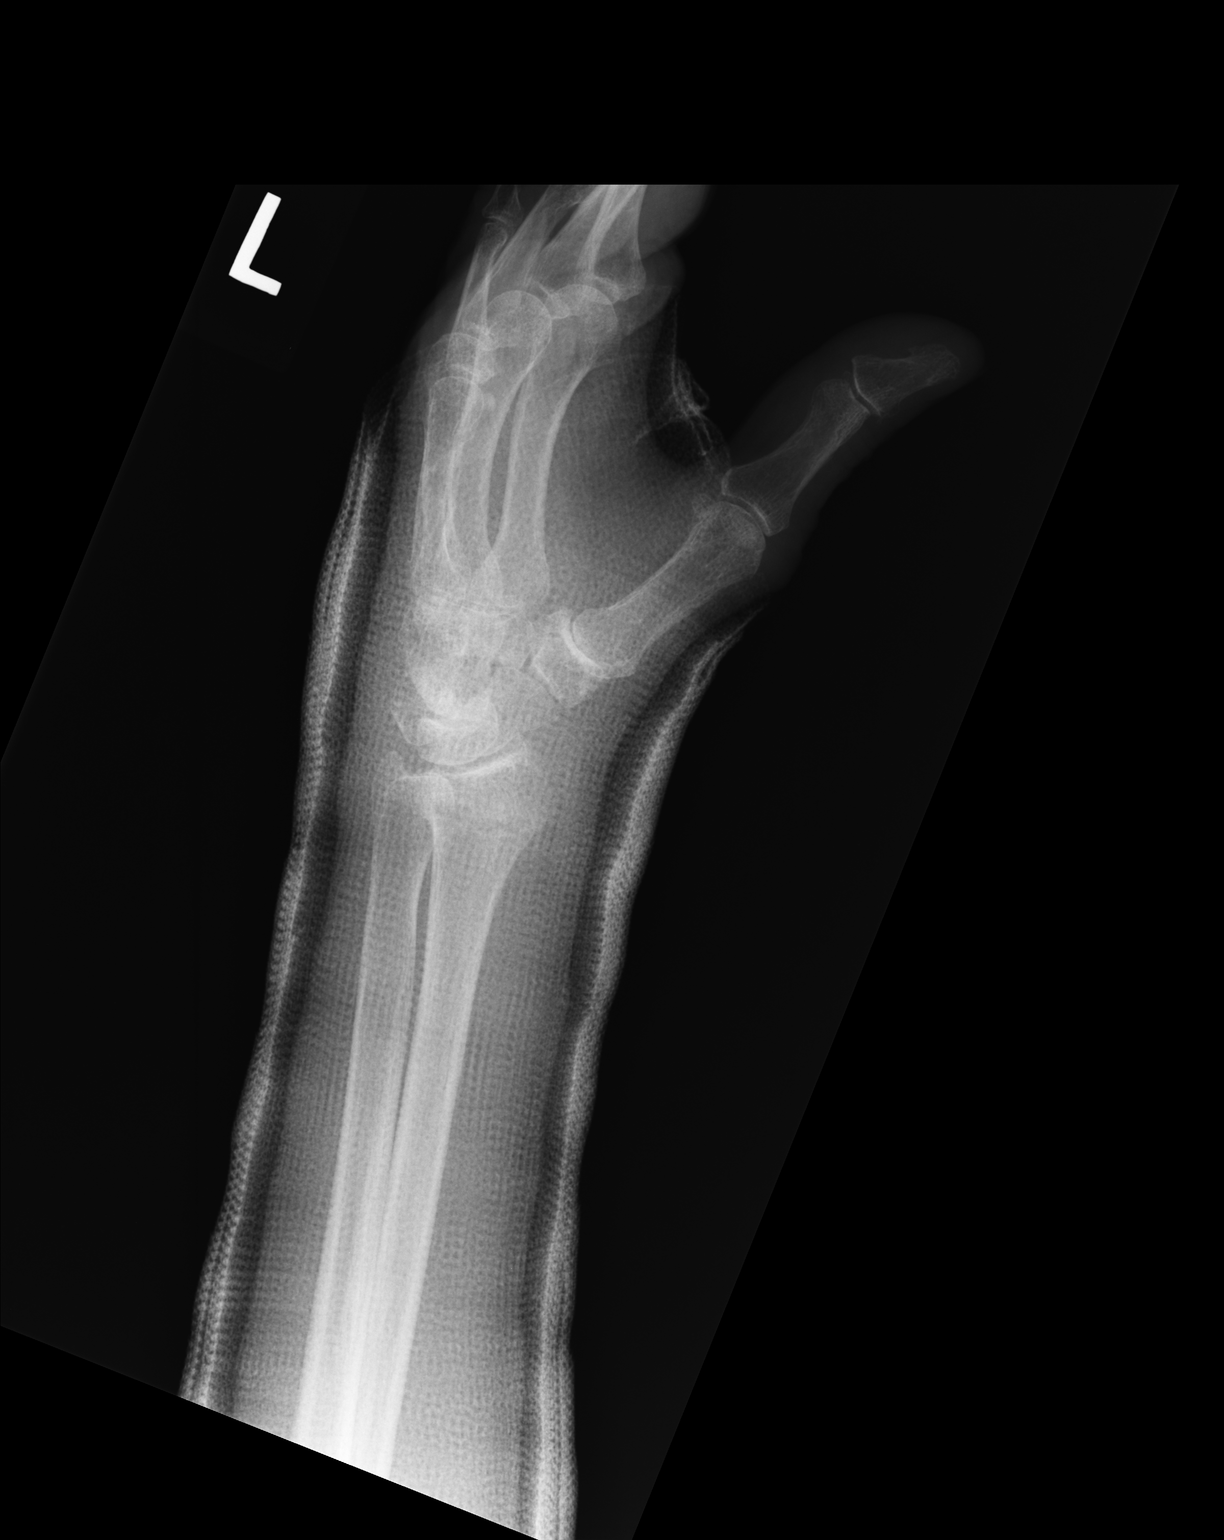

[ap ext rot]
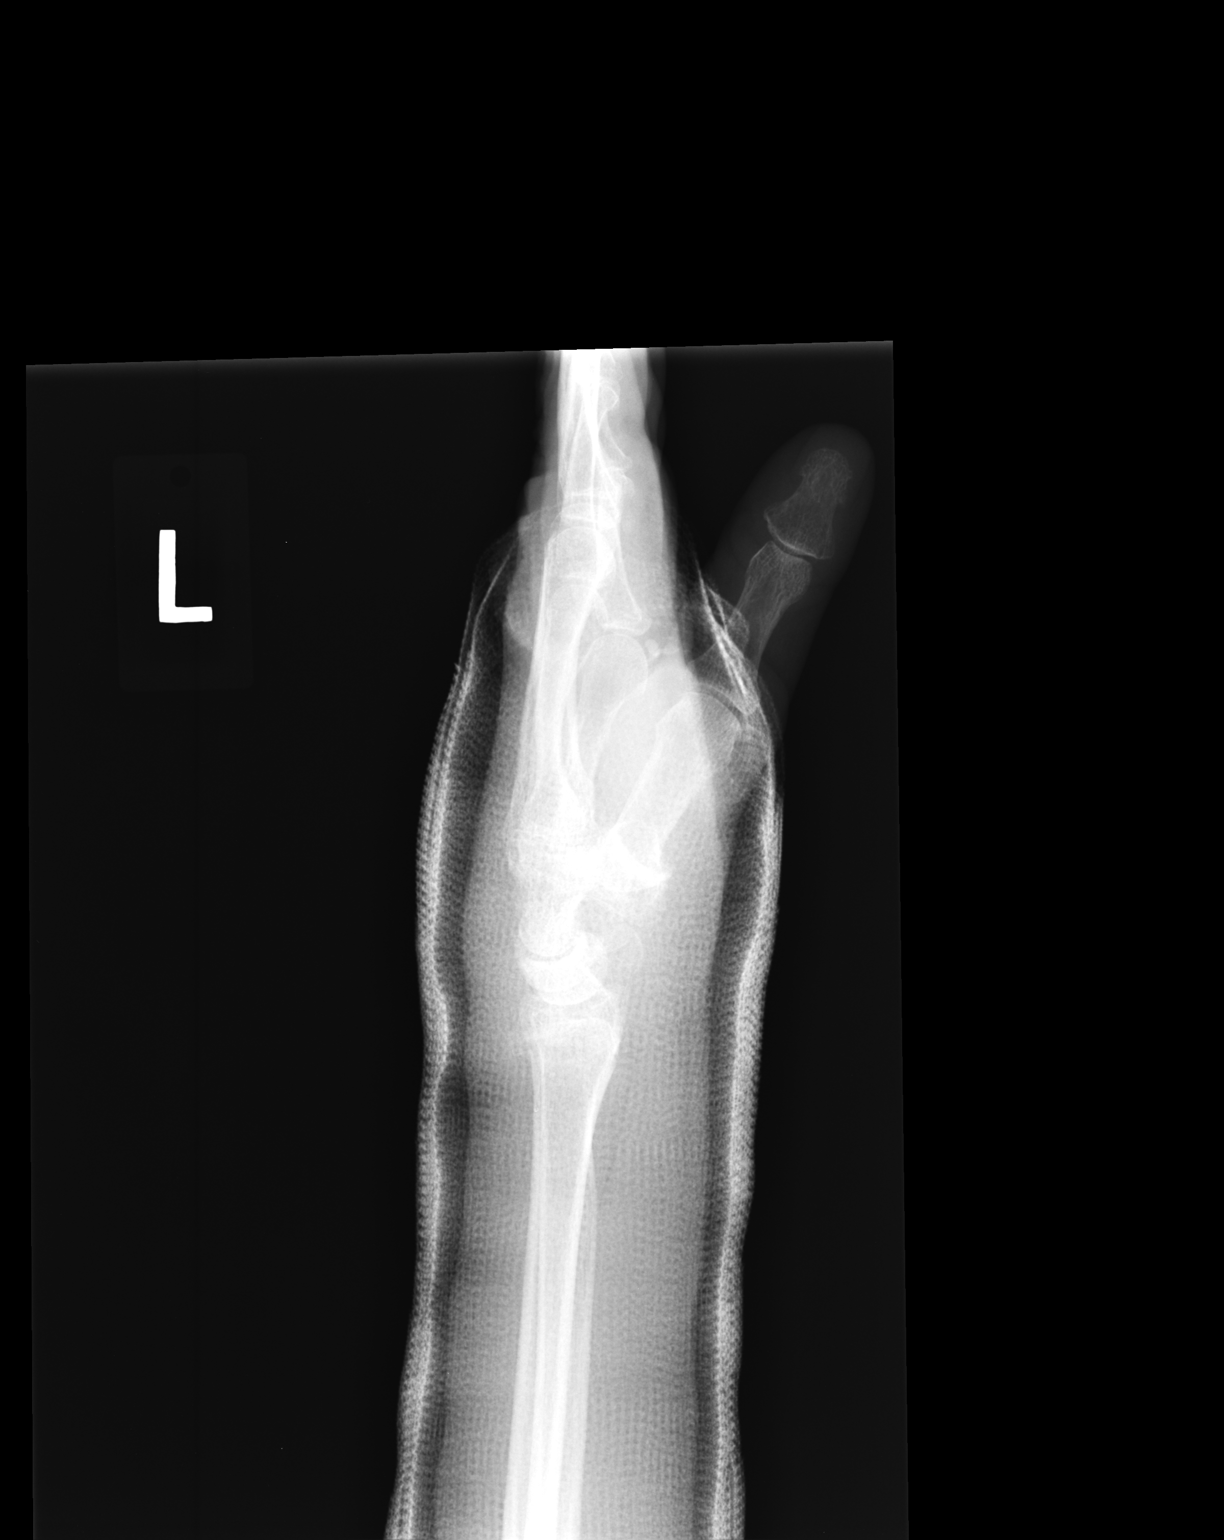

[3 of 3 positions shown; findings below may reference images not displayed]

FINDINGS: Cast is in place obscuring bony detail

Nondisplaced fracture distal radius . Increased lucency at the
fracture site compatible with bone resorption. No displacement. No
new fracture.
IMPRESSION: Distal radial fracture. Increased bone resorption of the fracture
site without displacement. Follow-up images without the cast are
suggested.

## 2015-11-12 ENCOUNTER — Other Ambulatory Visit: Payer: Self-pay | Admitting: Family Medicine

## 2015-11-12 DIAGNOSIS — R921 Mammographic calcification found on diagnostic imaging of breast: Secondary | ICD-10-CM

## 2015-11-16 IMAGING — CR DG WRIST 2V*L*
2 series · 2 of 2 positions shown · non-contrast
Comparison: Left wrist films of 07/09/2015

CLINICAL DATA: Followup Colles fracture from 6 weeks prior

EXAM:
LEFT WRIST - 2 VIEW

[PA]
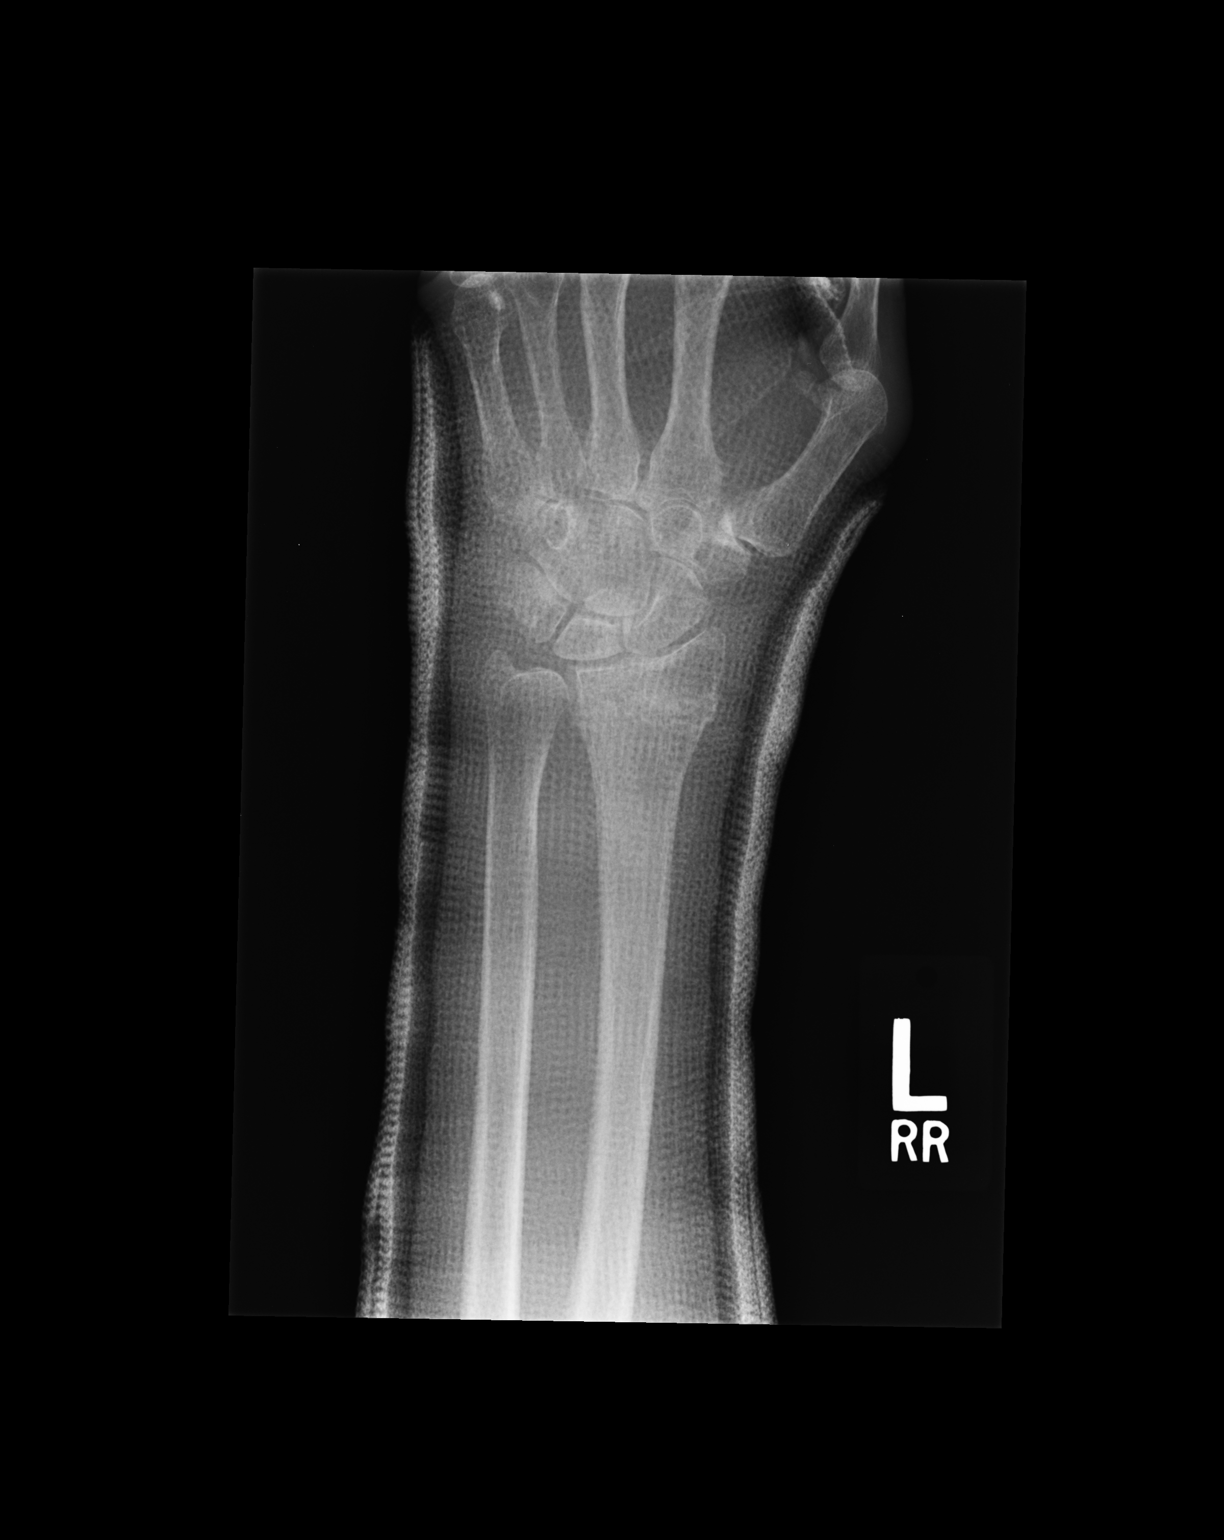

[lateral]
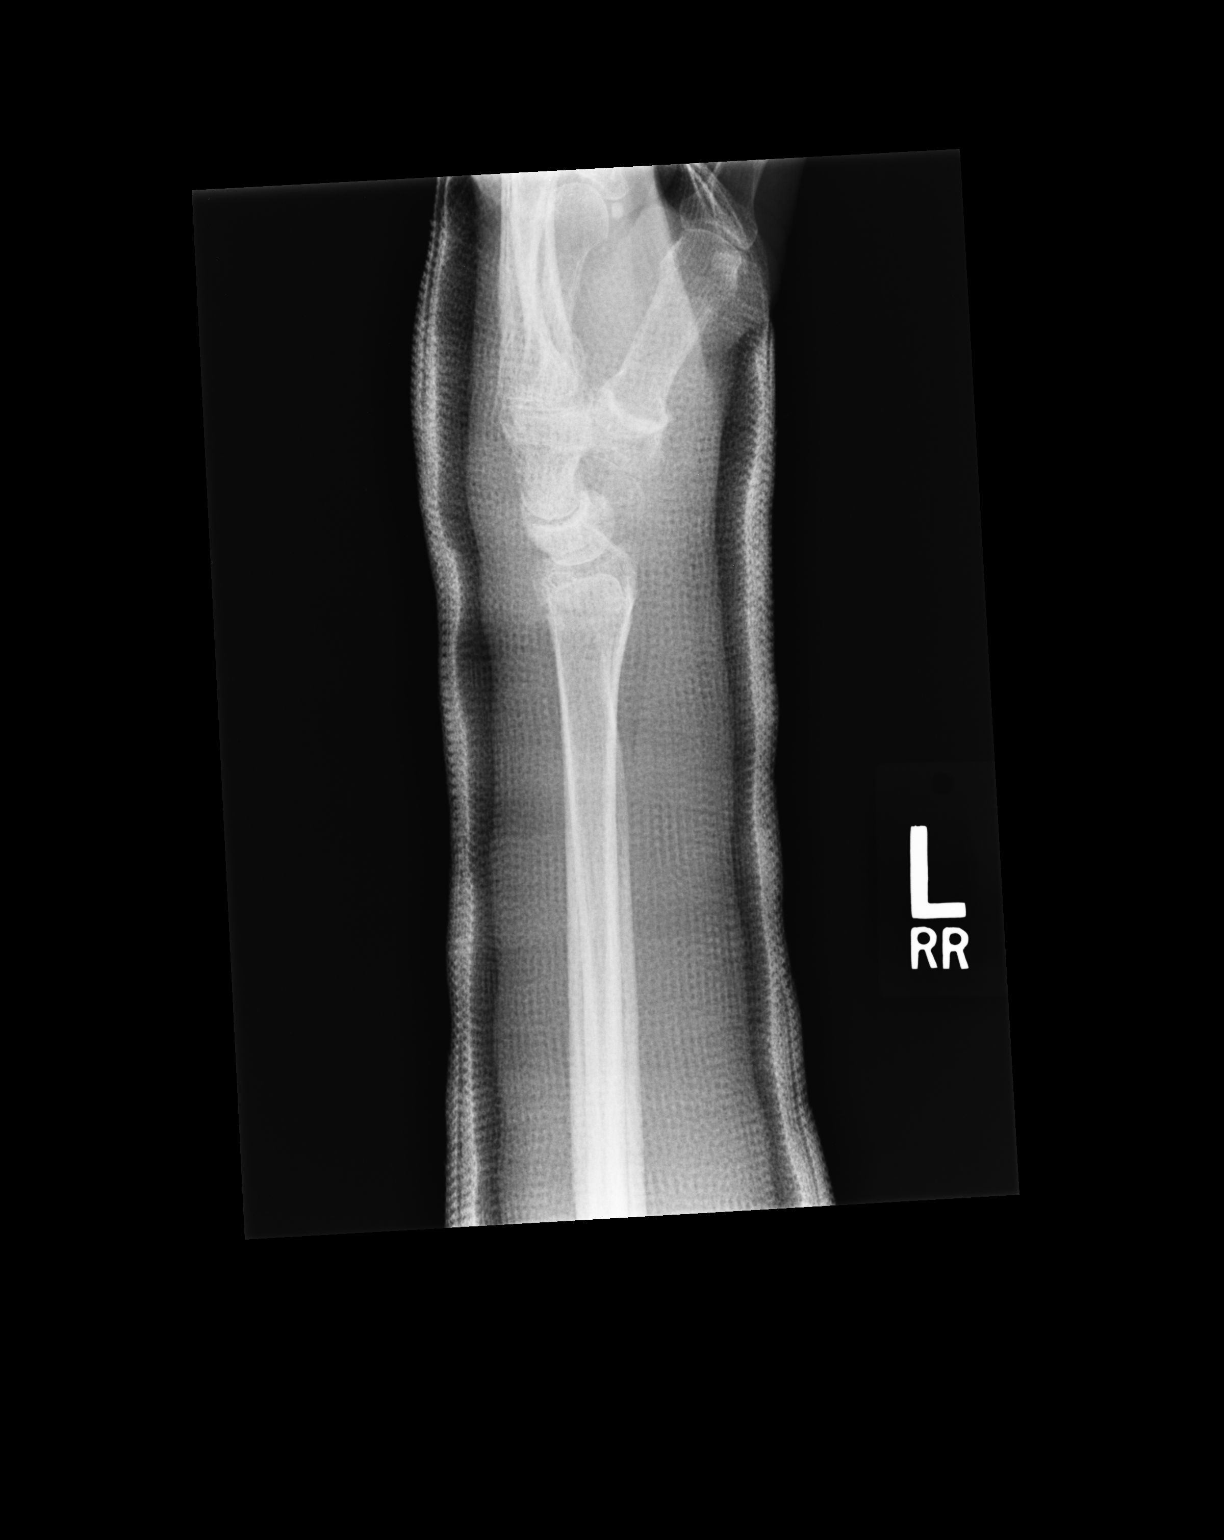

[2 of 2 positions shown; findings below may reference images not displayed]

FINDINGS: There is slightly more sclerosis along the fracture line through the
transverse aspect of the distal left radius indicating some interval
healing. No displacement is seen. The radiocarpal joint space is
unremarkable and the carpal bones remain in normal position. The
left forearm and wrist are in a splint.
IMPRESSION: Some interval healing of the transverse fracture of the distal left
radius in splint.

## 2015-12-07 IMAGING — CR DG WRIST 2V*L*
2 series · 2 of 2 positions shown · non-contrast
Comparison: In plaster views July 22, 2015.

CLINICAL DATA: Follow-up of collies fracture

EXAM:
LEFT WRIST - 2 VIEW

[AP]
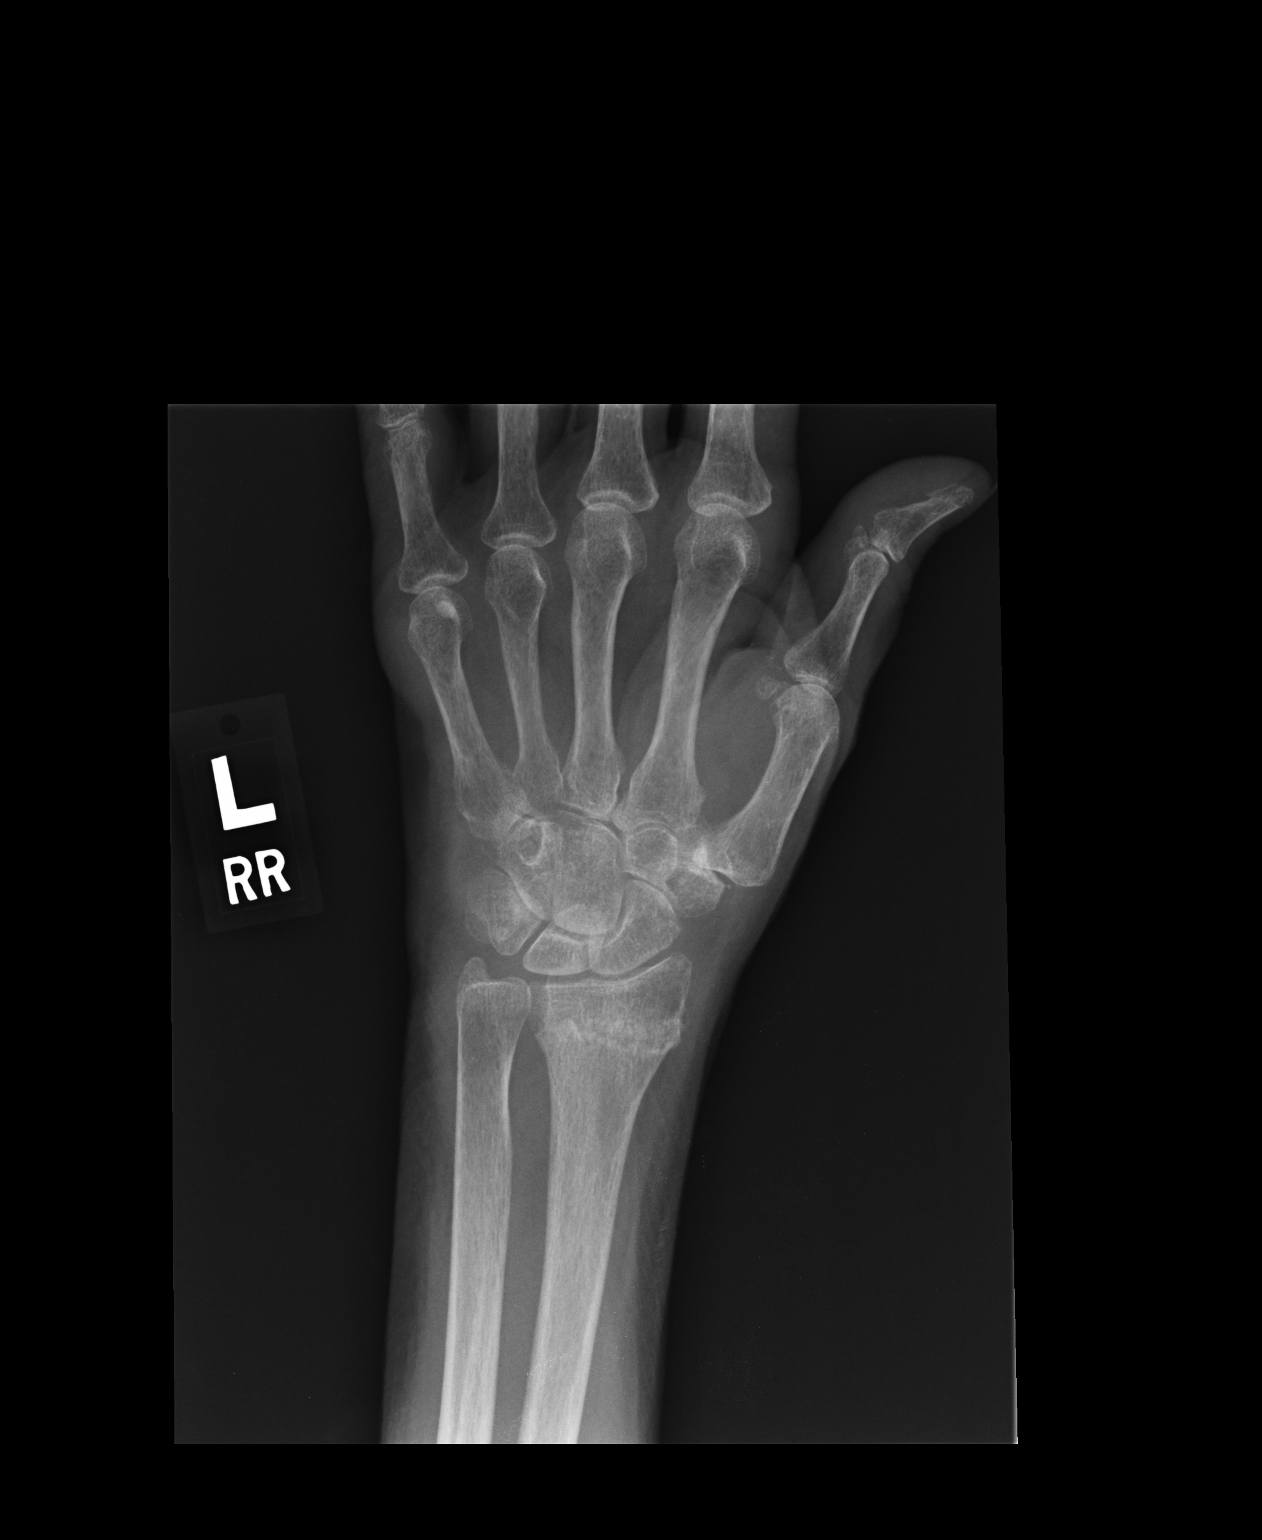

[lateral]
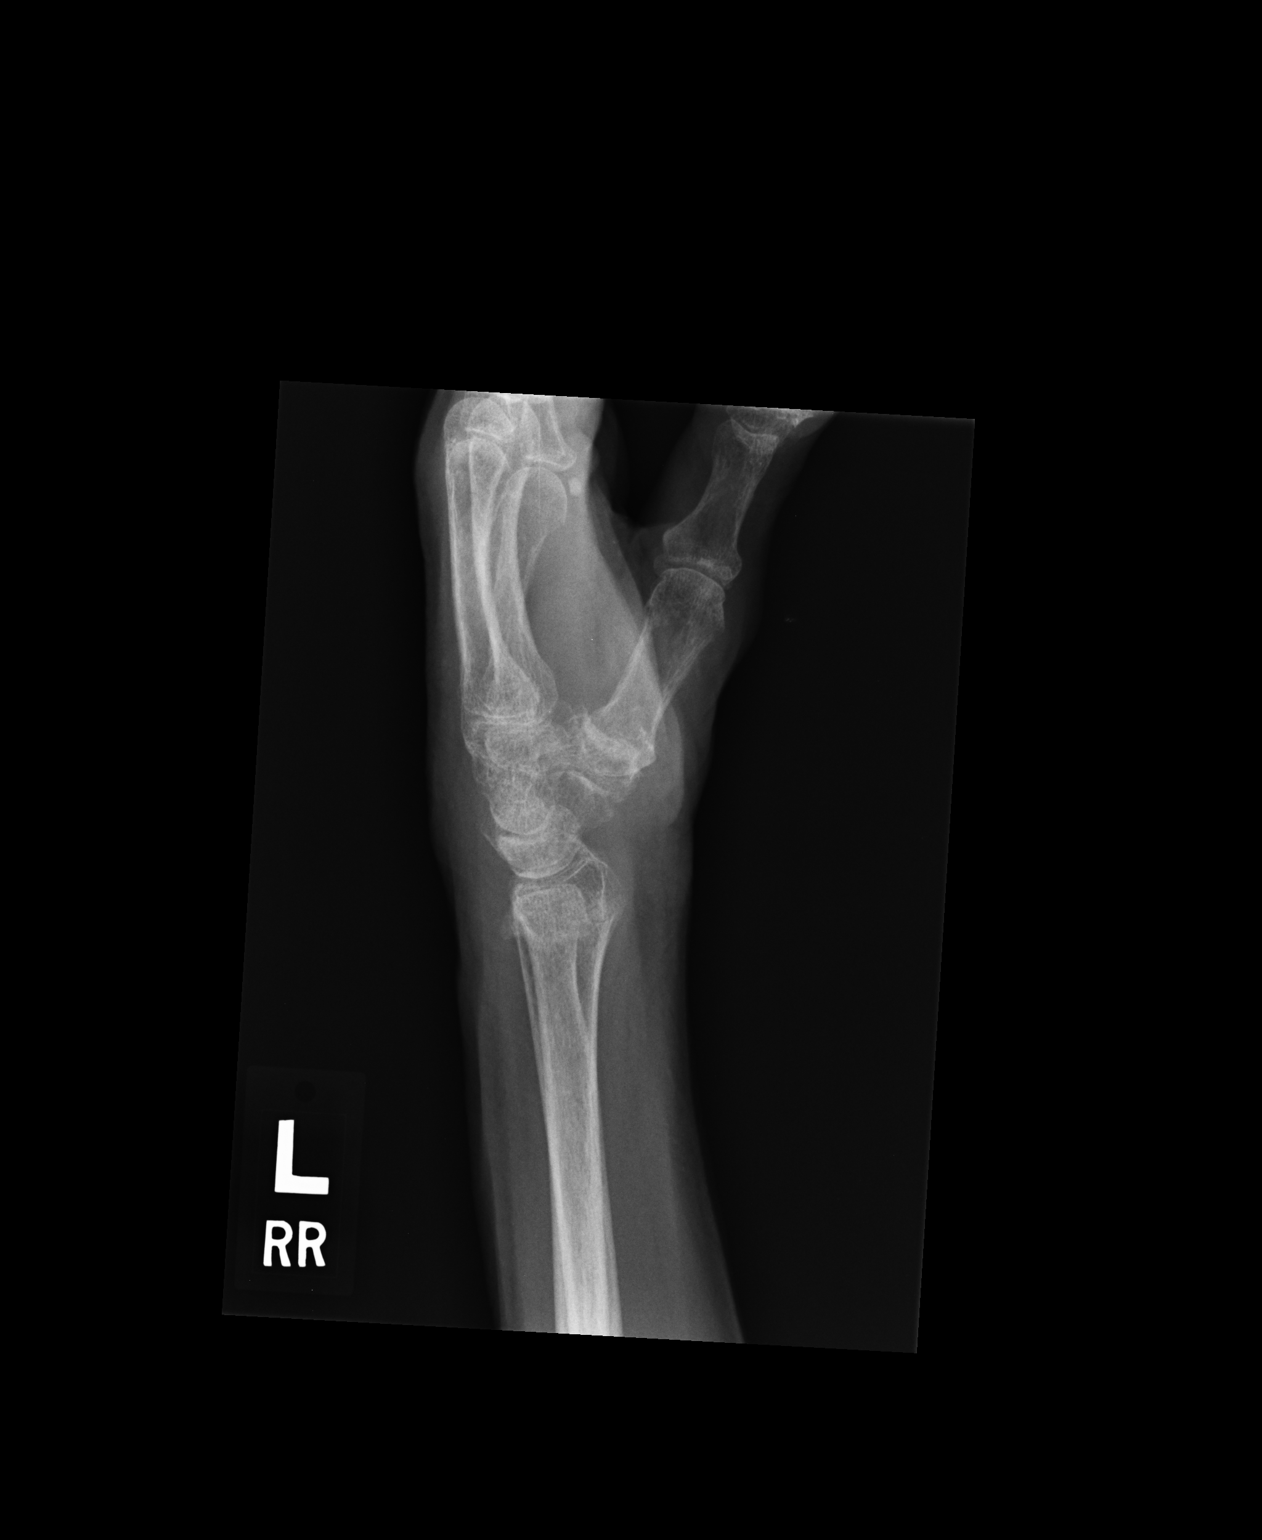

[2 of 2 positions shown; findings below may reference images not displayed]

FINDINGS: The cast is been removed. There is evidence of healing of the
impacted fracture of the distal left radial metaphysis. There is
sclerosis surrounding the fracture line. The fracture line remains
faintly visible however. The adjacent ulna exhibits no significant
abnormality. There is mild diffuse soft tissue swelling. There are
degenerative change of the first carpometacarpal joint.
IMPRESSION: There is ongoing but as yet incomplete healing of the transversely
oriented fracture through the distal left radial metaphysis.

## 2015-12-17 ENCOUNTER — Ambulatory Visit
Admission: RE | Admit: 2015-12-17 | Discharge: 2015-12-17 | Disposition: A | Discharge status: Home / Self Care | Payer: BLUE CROSS/BLUE SHIELD | Source: Ambulatory Visit | Attending: Family Medicine | Admitting: Family Medicine

## 2015-12-17 DIAGNOSIS — R921 Mammographic calcification found on diagnostic imaging of breast: Secondary | ICD-10-CM

## 2016-02-17 ENCOUNTER — Ambulatory Visit: Payer: BLUE CROSS/BLUE SHIELD

## 2016-02-18 ENCOUNTER — Ambulatory Visit (INDEPENDENT_AMBULATORY_CARE_PROVIDER_SITE_OTHER): Payer: BLUE CROSS/BLUE SHIELD | Admitting: Physician Assistant

## 2016-02-18 VITALS — BP 118/78 | HR 61 | Temp 97.5°F | Resp 16 | Ht 61.5 in | Wt 105.8 lb

## 2016-02-18 DIAGNOSIS — H01006 Unspecified blepharitis left eye, unspecified eyelid: Secondary | ICD-10-CM

## 2016-02-18 DIAGNOSIS — H01003 Unspecified blepharitis right eye, unspecified eyelid: Secondary | ICD-10-CM

## 2016-02-18 DIAGNOSIS — H02003 Unspecified entropion of right eye, unspecified eyelid: Secondary | ICD-10-CM

## 2016-02-18 MED ORDER — ERYTHROMYCIN 5 MG/GM OP OINT: 1.0000 "application " | TOPICAL_OINTMENT | Freq: Every day | OPHTHALMIC | Status: DC

## 2016-02-18 NOTE — Patient Instructions (Addendum)
IF you received an x-ray today, you will receive an invoice from [facility]. Please contact [facility] at [phone] with questions or concerns regarding your invoice.   IF you received labwork today, you will receive an invoice from [employer]. Please contact [facility] at [phone] with questions or concerns regarding your invoice.   Our billing staff will not be able to assist you with questions regarding bills from these companies.  You will be contacted with the lab results as soon as they are available. The fastest way to get your results is to activate your My Chart account. Instructions are located on the last page of this paperwork. If you have not heard from [place] regarding the results in 2 weeks, please contact this office.   Apply erythromycin ointment to lid margins at night for 4 weeks. (dr Katy Fitch may change this) Warm compresses. You will get a phone call to make appt with Dr. Katy Fitch.

## 2016-02-18 NOTE — Progress Notes (Signed)
[facility] [address] [place] [address] [phone]  Date:  02/18/2016   Name:  Sheryl Garrett   DOB:  07-Apr-1951   MRN:  [id number]  PCP:  No primary care provider on file.    Chief Complaint: Conjunctivitis   History of Present Illness:  This is a 65 y.o. female with PMH GERD, barrett esophagus, tobacco use, HTN who is presenting with eye problem x 2 weeks. Having right eye redness, itching and watering. States comes and goes. Symptoms worse with being outside. Wind bothers her a lot. Eye feels dry. No pain or foreign body sensation. No vision change. No nasal congestion, sore throat, fever, chills. Does have a history of env allergies but states they don't seem to be bothering her currently. Does have most allergy problems in spring/summer. No history eye problems. Last seen by an optometrist 1.5 years ago. She wears glasses, not contacts.  Review of Systems:  Review of Systems See HPI  Patient Active Problem List   Diagnosis Date Noted  . GERD (gastroesophageal reflux disease) 05/25/2015  . Colon adenomas 05/25/2015  . Diverticulosis of colon without hemorrhage 05/25/2015  . Barrett esophagus 03/08/2015  . Tobacco use disorder 03/08/2015  . Essential hypertension 03/08/2015  . Grief 03/08/2015    Prior to Admission medications   Medication Sig Start Date End Date Taking? Authorizing Provider  ALPRAZolam Duanne Moron) 1 MG tablet Take 1 mg by mouth 4 (four) times daily as needed for anxiety.   Yes Historical Provider, MD  fluticasone (FLONASE) 50 MCG/ACT nasal spray Place 2 sprays into both nostrils daily. 03/30/15  Yes Todd McVeigh, PA  HYDROcodone-acetaminophen (NORCO/VICODIN) 5-325 MG per tablet Take 1 tablet by mouth every 6 (six) hours as needed for moderate pain. 08/12/15  Yes Shawnee Knapp, MD  omeprazole (PRILOSEC) 40 MG capsule Take 1 capsule (40 mg total) by mouth daily. 03/08/15  Yes Todd McVeigh, PA  sertraline (ZOLOFT) 100 MG tablet Take 100 mg by  mouth daily.   Yes Historical Provider, MD  amLODipine-benazepril (LOTREL) 5-20 MG per capsule Take 1 capsule by mouth daily. Reported on 02/18/2016    Historical Provider, MD    No Known Allergies  History reviewed. No pertinent past surgical history.  Social History  Substance Use Topics  . Smoking status: Current Every Day Smoker -- 1.00 packs/day for 45 years  . Smokeless tobacco: None  . Alcohol Use: No    Family History  Problem Relation Age of Onset  . Drug abuse Daughter     Medication list has been reviewed and updated.  Physical Examination:  Physical Exam  Constitutional: She is oriented to person, place, and time. She appears well-developed and well-nourished. No distress.  HENT:  Head: Normocephalic and atraumatic.  Right Ear: Hearing normal.  Left Ear: Hearing normal.  Nose: Nose normal.  Eyes: EOM are normal. Pupils are equal, round, and reactive to light.  Yellow crusted discharge from bilateral eyes. Right conjunctiva injected. Left conjunctiva normal. Right lower lid mildly swollen and lid turning inward. Eyelashes rubbing against sclera. Left eyelids normal.   Pulmonary/Chest: Effort normal. No respiratory distress.  Musculoskeletal: Normal range of motion.  Neurological: She is alert and oriented to person, place, and time.  Skin: Skin is warm, dry and intact. No lesion and no rash noted.  Psychiatric: She has a normal mood and affect. Her speech is normal and behavior is normal. Thought content normal.    BP 118/78 mmHg  Pulse 61  Temp(Src) 97.5 F (36.4 C) (Oral)  Resp 16  Ht 5' 1.5" (1.562 m)  Wt 105 lb 12.8 oz (47.991 kg)  BMI 19.67 kg/m2  SpO2 98%   Visual Acuity Screening   Right eye Left eye Both eyes  Without correction:     With correction: 20/30 20/30 20/25     Assessment and Plan:  1. Blepharitis of both eyes 2. Entropion, right Blepharitis of both eyes as indicated by crusting discharge along both lower eyelid margins.  Entropion of right lower eye lid, likely causing the red eye and irritation. Gave erythromycin ointment to apply QHS x 4 weeks. Counseled on warm compresses. Referred to Dr. Katy Fitch for further eval. - erythromycin ophthalmic ointment; Place 1 application into both eyes at bedtime.  Dispense: 3.5 g; Refill: 0 - Ambulatory referral to Ophthalmology   Benjaman Pott. Drenda Freeze, MHS [facility]  02/18/2016

## 2016-03-21 ENCOUNTER — Encounter: Payer: Self-pay | Admitting: Physician Assistant

## 2016-03-21 DIAGNOSIS — H02009 Unspecified entropion of unspecified eye, unspecified eyelid: Secondary | ICD-10-CM | POA: Insufficient documentation

## 2016-05-09 ENCOUNTER — Other Ambulatory Visit: Payer: Self-pay | Admitting: Family Medicine

## 2016-05-09 DIAGNOSIS — R921 Mammographic calcification found on diagnostic imaging of breast: Secondary | ICD-10-CM

## 2016-05-12 ENCOUNTER — Ambulatory Visit
Admission: RE | Admit: 2016-05-12 | Discharge: 2016-05-12 | Disposition: A | Discharge status: Home / Self Care | Payer: BLUE CROSS/BLUE SHIELD | Source: Ambulatory Visit | Attending: Family Medicine | Admitting: Family Medicine

## 2016-05-12 ENCOUNTER — Other Ambulatory Visit: Payer: Self-pay | Admitting: Family Medicine

## 2016-05-12 DIAGNOSIS — R921 Mammographic calcification found on diagnostic imaging of breast: Secondary | ICD-10-CM

## 2016-06-12 ENCOUNTER — Emergency Department (HOSPITAL_COMMUNITY)
Admission: EM | Admit: 2016-06-12 | Discharge: 2016-06-13 | Disposition: A | Discharge status: Home / Self Care | Payer: BLUE CROSS/BLUE SHIELD | Attending: Emergency Medicine | Admitting: Emergency Medicine

## 2016-06-12 ENCOUNTER — Encounter (HOSPITAL_COMMUNITY): Payer: Self-pay

## 2016-06-12 ENCOUNTER — Emergency Department (HOSPITAL_COMMUNITY): Payer: BLUE CROSS/BLUE SHIELD

## 2016-06-12 DIAGNOSIS — M546 Pain in thoracic spine: Secondary | ICD-10-CM | POA: Insufficient documentation

## 2016-06-12 DIAGNOSIS — Z79899 Other long term (current) drug therapy: Secondary | ICD-10-CM | POA: Insufficient documentation

## 2016-06-12 DIAGNOSIS — F17200 Nicotine dependence, unspecified, uncomplicated: Secondary | ICD-10-CM | POA: Insufficient documentation

## 2016-06-12 DIAGNOSIS — I10 Essential (primary) hypertension: Secondary | ICD-10-CM | POA: Diagnosis not present

## 2016-06-12 MED ORDER — CYCLOBENZAPRINE HCL 10 MG PO TABS
5.0000 mg | ORAL_TABLET | Freq: Once | ORAL | Status: AC
  Administered 2016-06-13: 5 mg via ORAL
  Filled 2016-06-12: qty 1

## 2016-06-12 MED ORDER — KETOROLAC TROMETHAMINE 60 MG/2ML IM SOLN
15.0000 mg | Freq: Once | INTRAMUSCULAR | Status: AC
  Administered 2016-06-13: 15 mg via INTRAMUSCULAR
  Filled 2016-06-12: qty 2

## 2016-06-12 NOTE — ED Provider Notes (Signed)
History  By signing my name below, I, Bea Graff, attest that this documentation has been prepared under the direction and in the presence of [facility], [place]. Electronically Signed: Bea Graff, ED Scribe. 06/12/2016. 11:36 PM.  Chief Complaint  Patient presents with  . Back Pain   The history is provided by the patient and medical records. No language interpreter was used.    HPI Comments:  Sheryl Garrett is a 65 y.o. female with PMHx of bulging discs who presents to the Emergency Department complaining of mid upper back pain that began about two days ago. She states the pain normally subsides on its own but it has not been this bad before. She reports her last exacerbation was 10-12 years ago. She states she normally uses Salonpas and has been applying a heating pad with no significant relief of the pain. She denies modifying factors. She denies numbness, tingling or weakness of the upper extremities. She denies any trauma, injury or fall. She has an appt in 10 day (06/22/16) with Dr. Sharol Given. She is ambulatory without issue or assistance.  Past Medical History  Diagnosis Date  . Hypertension   . GERD (gastroesophageal reflux disease)    History reviewed. No pertinent past surgical history. Family History  Problem Relation Age of Onset  . Drug abuse Daughter    Social History  Substance Use Topics  . Smoking status: Current Every Day Smoker -- 1.00 packs/day for 45 years  . Smokeless tobacco: None  . Alcohol Use: No   OB History    No data available     Review of Systems  Musculoskeletal: Positive for back pain.  Neurological: Negative for weakness and numbness.  All other systems reviewed and are negative.   Allergies  Review of patient's allergies indicates no known allergies.  Home Medications   Prior to Admission medications   Medication Sig Start Date End Date Taking? Authorizing Provider  ALPRAZolam Duanne Moron) 1 MG tablet Take 1 mg by mouth 4 (four) times daily  as needed for anxiety.    Historical Provider, MD  amLODipine-benazepril (LOTREL) 5-20 MG per capsule Take 1 capsule by mouth daily. Reported on 02/18/2016    Historical Provider, MD  cyclobenzaprine (FLEXERIL) 5 MG tablet Take 1 tablet (5 mg total) by mouth 3 (three) times daily as needed for muscle spasms. 06/13/16   Hope Bunnie Pion, NP  erythromycin ophthalmic ointment Place 1 application into both eyes at bedtime. 02/18/16   Ezekiel Slocumb, PA-C  fluticasone (FLONASE) 50 MCG/ACT nasal spray Place 2 sprays into both nostrils daily. 03/30/15   Araceli Bouche, PA  HYDROcodone-acetaminophen (NORCO/VICODIN) 5-325 MG per tablet Take 1 tablet by mouth every 6 (six) hours as needed for moderate pain. 08/12/15   Shawnee Knapp, MD  naproxen (NAPROSYN) 375 MG tablet Take 1 tablet (375 mg total) by mouth 2 (two) times daily. 06/13/16   Hope Bunnie Pion, NP  omeprazole (PRILOSEC) 40 MG capsule Take 1 capsule (40 mg total) by mouth daily. 03/08/15   Araceli Bouche, PA  sertraline (ZOLOFT) 100 MG tablet Take 100 mg by mouth daily.    Historical Provider, MD   Physical Exam  Constitutional: She is oriented to person, place, and time. She appears well-developed and well-nourished. No distress.  HENT:  Head: Normocephalic and atraumatic.  Right Ear: Tympanic membrane normal.  Left Ear: Tympanic membrane normal.  Nose: Nose normal.  Mouth/Throat: Uvula is midline, oropharynx is clear and moist and mucous membranes are normal.  Eyes: EOM  are normal.  Neck: Normal range of motion. Neck supple.  Cardiovascular: Normal rate and regular rhythm.   Pulmonary/Chest: Effort normal. She has no wheezes. She has no rales.  Abdominal: Soft. Bowel sounds are normal. There is no tenderness.  Musculoskeletal: Normal range of motion.       Thoracic back: She exhibits tenderness, pain and spasm. She exhibits normal pulse.  Neurological: She is alert and oriented to person, place, and time. She has normal strength. No cranial nerve deficit or  sensory deficit. Gait normal.  Reflex Scores:      Bicep reflexes are 2+ on the right side and 2+ on the left side.      Brachioradialis reflexes are 2+ on the right side and 2+ on the left side.      Patellar reflexes are 2+ on the right side and 2+ on the left side.      Achilles reflexes are 2+ on the right side and 2+ on the left side. Skin: Skin is warm and dry.  Psychiatric: She has a normal mood and affect. Her behavior is normal.  Nursing note and vitals reviewed.   ED Course  Procedures (including critical care time) DIAGNOSTIC STUDIES:   COORDINATION OF CARE: 11:25 PM- Will X-Ray thoracic spine and order muscle relaxer. Pt verbalizes understanding and agrees to plan.  Medications  cyclobenzaprine (FLEXERIL) tablet 5 mg (5 mg Oral Given 06/13/16 0008)  ketorolac (TORADOL) injection 15 mg (15 mg Intramuscular Given 06/13/16 0008)   After medications patient reports pain has improved and she is ready to go home and go to bed.   Labs Review Labs Reviewed - No data to display  Imaging Review Dg Thoracic Spine 2 View  06/13/2016  CLINICAL DATA:  Thoracic spine pain. Progressive chronic pain, now "unbearable". EXAM: THORACIC SPINE 2 VIEWS COMPARISON:  None. FINDINGS: Thoracic spine alignment is maintained. There is mild to moderate diffuse disc space narrowing and endplate spurring most prominent in the mid and lower thoracic spine. Vertebral body heights are maintained, no evidence of compression deformity. No paravertebral soft tissue abnormality. No acute osseous abnormality is seen. 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 No radiographic evidence of acute bony abnormality. Electronically Signed   By: Jeb Levering M.D.   On: 06/13/2016 00:13    MDM  65 y.o. female with thoracic back pain x 3 days. Stable for d/c with improvement of pain after medication and no focal neuro deficits or red flags to indicate need for immediate  for neuro consult.  Discussed with the patient and all questioned fully answered. She will call me if any problems arise.  Final diagnoses:  Midline thoracic back pain   I personally performed the services described in this documentation, which was scribed in my presence. The recorded information has been reviewed and is accurate.     [place], [place] 06/13/16 2227  Merrily Pew, MD 06/14/16 [phone]

## 2016-06-12 NOTE — ED Notes (Signed)
Pt states hx: bulging disks. Pt complaining of upper back pain x 3 days. Pt with orthopedic appointment next week. Pain not changed with heat or self medication.

## 2016-06-13 MED ORDER — NAPROXEN 375 MG PO TABS: 375.0000 mg | ORAL_TABLET | Freq: Two times a day (BID) | ORAL | Status: DC

## 2016-06-13 MED ORDER — CYCLOBENZAPRINE HCL 5 MG PO TABS: 5.0000 mg | ORAL_TABLET | Freq: Three times a day (TID) | ORAL | Status: DC | PRN

## 2016-06-13 NOTE — Discharge Instructions (Signed)
Keep your follow up appointment with Dr. Sharol Given. Do not drive while taking the muscle relaxant as it will make you sleepy.

## 2016-06-13 NOTE — ED Notes (Signed)
Pt discharged by [facility], NP. Pt stated she "was feeling better and ready to go".

## 2016-06-22 ENCOUNTER — Ambulatory Visit: Payer: BLUE CROSS/BLUE SHIELD | Admitting: Family Medicine

## 2016-10-19 ENCOUNTER — Ambulatory Visit (INDEPENDENT_AMBULATORY_CARE_PROVIDER_SITE_OTHER): Payer: BLUE CROSS/BLUE SHIELD | Admitting: Family Medicine

## 2016-10-19 ENCOUNTER — Encounter: Payer: Self-pay | Admitting: Family Medicine

## 2016-10-19 VITALS — BP 130/84 | HR 88 | Temp 98.0°F | Resp 20 | Ht 61.5 in | Wt 105.2 lb

## 2016-10-19 DIAGNOSIS — Z131 Encounter for screening for diabetes mellitus: Secondary | ICD-10-CM | POA: Diagnosis not present

## 2016-10-19 DIAGNOSIS — F32A Depression, unspecified: Secondary | ICD-10-CM

## 2016-10-19 DIAGNOSIS — Z5181 Encounter for therapeutic drug level monitoring: Secondary | ICD-10-CM

## 2016-10-19 DIAGNOSIS — Z720 Tobacco use: Secondary | ICD-10-CM | POA: Diagnosis not present

## 2016-10-19 DIAGNOSIS — Z0000 Encounter for general adult medical examination without abnormal findings: Secondary | ICD-10-CM

## 2016-10-19 DIAGNOSIS — Z13220 Encounter for screening for lipoid disorders: Secondary | ICD-10-CM | POA: Diagnosis not present

## 2016-10-19 DIAGNOSIS — J309 Allergic rhinitis, unspecified: Secondary | ICD-10-CM

## 2016-10-19 DIAGNOSIS — F329 Major depressive disorder, single episode, unspecified: Secondary | ICD-10-CM

## 2016-10-19 DIAGNOSIS — Z23 Encounter for immunization: Secondary | ICD-10-CM | POA: Diagnosis not present

## 2016-10-19 MED ORDER — ZOSTER VACCINE LIVE 19400 UNT/0.65ML ~~LOC~~ SUSR: 0.6500 mL | Freq: Once | SUBCUTANEOUS | 0 refills | Status: AC

## 2016-10-19 NOTE — Patient Instructions (Addendum)
OK to hold off meds for now for blood pressure, but keep a record of your blood pressures outside of the office and if over 140/90 - return to discuss medicines.   You are overdue for pap testing - return to have that performed when you are ready. If you have that testing performed, it may be the last test you need.   Follow up as planned for mammogram and ultrasound of breast. If they need an order from me, let me know and I will place that order.   [place] offers smoking cessation clinics. Registration is required. To register call [phone] or register online at [email or web address] When you are ready, call that number to help with quitting.   Follow-up with me in the next few months to discuss the shortness of breath symptoms as well as to discuss the results from your stress test last year indicating some possible lung cause of decreased exercise tolerance. Return to the clinic or go to the nearest emergency room if any of your symptoms worsen or new symptoms occur.  I will check some blood work today as we discussed in those results will come through your my chart account. Let me know if you have any questions in the meantime.  Keeping You Healthy  Get These Tests  Blood Pressure- Have your blood pressure checked by your healthcare provider at least once a year.  Normal blood pressure is 120/80.  Weight- Have your body mass index (BMI) calculated to screen for obesity.  BMI is a measure of body fat based on height and weight.  You can calculate your own BMI at [employer]velB[employer].it  Cholesterol- Have your cholesterol checked every year.  Diabetes- Have your blood sugar checked every year if you have high blood pressure, high cholesterol, a family history of diabetes or if you are overweight.  Pap Test - Have a pap test every 1 to 5 years if you have been sexually active.  If you are older than 65 and recent pap tests have been normal you may not need additional pap tests.  In  addition, if you have had a hysterectomy  for benign disease additional pap tests are not necessary.  Mammogram-Yearly mammograms are essential for early detection of breast cancer  Screening for Colon Cancer- Colonoscopy starting at age 47. Screening may begin sooner depending on your family history and other health conditions.  Follow up colonoscopy as directed by your Gastroenterologist.  Screening for Osteoporosis- Screening begins at age 46 with bone density scanning, sooner if you are at higher risk for developing Osteoporosis.  Get these medicines  Calcium with Vitamin D- Your body requires 1200-1500 mg of Calcium a day and [id number] IU of Vitamin D a day.  You can only absorb 500 mg of Calcium at a time therefore Calcium must be taken in 2 or 3 separate doses throughout the day.  Hormones- Hormone therapy has been associated with increased risk for certain cancers and heart disease.  Talk to your healthcare provider about if you need relief from menopausal symptoms.  Aspirin- Ask your healthcare provider about taking Aspirin to prevent Heart Disease and Stroke.  Get these Immuniztions  Flu shot- Every fall  Pneumonia shot- Once after the age of 25; if you are younger ask your healthcare provider if you need a pneumonia shot.  Tetanus- Every ten years.  Zostavax- Once after the age of 37 to prevent shingles.  Take these steps  Don't smoke- Your healthcare provider can help you quit.  For tips on how to quit, ask your healthcare provider or go to www.smokefree.gov or call 1-800 QUIT-NOW.  Be physically active- Exercise 5 days a week for a minimum of 30 minutes.  If you are not already physically active, start slow and gradually work up to 30 minutes of moderate physical activity.  Try walking, dancing, bike riding, swimming, etc.  Eat a healthy diet- Eat a variety of healthy foods such as fruits, vegetables, whole grains, low fat milk, low fat cheeses, yogurt, lean meats, chicken,  fish, eggs, dried beans, tofu, etc.  For more information go to www.thenutritionsource.org  Dental visit- Brush and floss teeth twice daily; visit your dentist twice a year.  Eye exam- Visit your Optometrist or Ophthalmologist yearly.  Drink alcohol in moderation- Limit alcohol intake to one drink or less a day.  Never drink and drive.  Depression- Your emotional health is as important as your physical health.  If you're feeling down or losing interest in things you normally enjoy, please talk to your healthcare provider.  Seat Belts- can save your life; always wear one  Smoke/Carbon Monoxide detectors- These detectors need to be installed on the appropriate level of your home.  Replace batteries at least once a year.  Violence- If anyone is threatening or hurting you, please tell your healthcare provider.  Living Will/ Health care power of attorney- Discuss with your healthcare provider and family.     IF you received an x-ray today, you will receive an invoice from [facility]. Please contact [facility] Radiology at [phone] with questions or concerns regarding your invoice.   IF you received labwork today, you will receive an invoice from [employer]. Please contact [facility] at [phone] with questions or concerns regarding your invoice.   Our billing staff will not be able to assist you with questions regarding bills from these companies.  You will be contacted with the lab results as soon as they are available. The fastest way to get your results is to activate your My Chart account. Instructions are located on the last page of this paperwork. If you have not heard from [place] regarding the results in 2 weeks, please contact this office.

## 2016-10-19 NOTE — Progress Notes (Signed)
By signing my name below, I, Mesha Guinyard, attest that this documentation has been prepared under the direction and in the presence of [name], MD.  Electronically Signed: Verlee Monte, Medical Scribe. 10/19/16. 3:14 PM.  Subjective:    Patient ID: Sheryl Garrett, female    DOB: 22-Mar-1951, 65 y.o.   MRN: [id number]  HPI Chief Complaint  Patient presents with  . Annual Exam    HPI Comments: Gerre Yoshino is a 65 y.o. female with a PMHx of HTN, GERD with barrett esophagus, colon adenomas, and diverticulosis, tobacco abuse, and grief reaction to loosing her husband to [place] in 2015 who presents to the [facility] for annual exam.  HTN: Takes Lotrel 5-20mg . Stopped taking lotrel 2 years ago. Reports her bp today has been the highest it's ever been and suspects it's secondary from being stressed earlier. Lab Results  Component Value Date   CREATININE 0.60 03/30/2015   Lab Results  Component Value Date   CHOL 195 03/30/2015   HDL 105 03/30/2015   LDLCALC 79 03/30/2015   TRIG 54 03/30/2015   CHOLHDL 1.9 03/30/2015   BP Readings from Last 3 Encounters:  10/19/16 130/84  02/18/16 118/78  10/08/15 116/70   GERD: With hx of barrettes esophagus. EGD every 2 years, most recent June 2016. [facility], barettes metaplasia, repeat in 2018 continue on chronic PPI, omeprazole 40 mg QD. She had a nl magnesium and a nl Vit B12 April 2016.  Cardio: Exercise treadmill test June 2016 4.78 METS. Reduced areobic tolerance, but no ischemia signs.  Tobacco Abuse: Continues to smoke but isn't sure about cessation.  Cancer Screening: Breast CA: Mammogram 05/2016 with diagnostic for right breast calcification, benign calcifications on June imaging. There was a left medial breast tiny asymmetry likely bening. Recommended 6 month follow-up with diagnostic mammogram and possible US of the left breast.  Colon CA: Colonoscopy 2016. Took out polyp every  Cervical CA: Pap smear has been  over 5 years. Skin CA: Is followed by a dermatologist FHx: Mom died in childbirth, dad had MI in his 27s, sister died of CA with multiple organ involved.  Immunizations: Had the chicken pox and would like to get the shingles vaccine. Immunization History  Administered Date(s) Administered  . Influenza-Unspecified 08/26/2016  . Pneumococcal Polysaccharide-23 03/30/2015  . Tdap 03/30/2015   Vision: Is followed by Dr. Schuyler Amor.  Visual Acuity Screening   Right eye Left eye Both eyes  Without correction:     With correction: 20/25 20/25 20/25    Dentist: Followed by a dentist but hasn't seen them in the past 6 months due to her insurance.  Exercise: Doesn't exercise much.  Hep C/ HIV Screening: Was never screened for HIV or Hep C, and would like to be screened for both.  Depression: PHQ of 15, no SI. She has been here for grief in the past. Depression symptoms with zoloft 100mg  QD, and xanax 1mg   PRN, which was Rx in march 2016. Reports she lost her husband, father, and sister within the past 2 years. Pt is followed by Dr. Noemi Chapel at [facility]. Mentions she has Denies SI, or self-harm. Depression screen [facility] 2/9 10/19/2016 10/19/2016 02/18/2016 10/08/2015 06/15/2015  Decreased Interest 3 0 0 0 2  Down, Depressed, Hopeless 3 0 0 0 2  PHQ - 2 Score 6 0 0 0 4  Altered sleeping 2 - - 0 2  Tired, decreased energy 3 - - 0 2  Change in appetite  3 - - 0 2  Feeling bad or failure about yourself  0 - - 0 2  Trouble concentrating 1 - - 0 2  Moving slowly or fidgety/restless 0 - - 0 2  Suicidal thoughts 0 - - 0 0  PHQ-9 Score 15 - - 0 16  Difficult doing work/chores Somewhat difficult - - Not difficult at all Somewhat difficult    Patient Active Problem List   Diagnosis Date Noted  . 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 GERD (gastroesophageal reflux disease) 05/25/2015  . Colon adenomas 05/25/2015  . Diverticulosis of colon without hemorrhage 05/25/2015  . Barrett esophagus 03/08/2015  . Tobacco  use disorder 03/08/2015  . Essential hypertension 03/08/2015  . Grief 03/08/2015   Past Medical History:  Diagnosis Date  . GERD (gastroesophageal reflux disease)   . Hypertension    Past Surgical History:  Procedure Laterality Date  . EYE SURGERY     eyelid turned inward on right eye   No Known Allergies Prior to Admission medications   Medication Sig Start Date End Date Taking? Authorizing Provider  ALPRAZolam Duanne Moron) 1 MG tablet Take 1 mg by mouth 4 (four) times daily as needed for anxiety.    Historical Provider, MD  amLODipine-benazepril (LOTREL) 5-20 MG per capsule Take 1 capsule by mouth daily. Reported on 02/18/2016    Historical Provider, MD  cyclobenzaprine (FLEXERIL) 5 MG tablet Take 1 tablet (5 mg total) by mouth 3 (three) times daily as needed for muscle spasms. 06/13/16   Hope Bunnie Pion, NP  erythromycin ophthalmic ointment Place 1 application into both eyes at bedtime. 02/18/16   Ezekiel Slocumb, PA-C  fluticasone (FLONASE) 50 MCG/ACT nasal spray Place 2 sprays into both nostrils daily. 03/30/15   Araceli Bouche, PA  HYDROcodone-acetaminophen (NORCO/VICODIN) 5-325 MG per tablet Take 1 tablet by mouth every 6 (six) hours as needed for moderate pain. 08/12/15   Shawnee Knapp, MD  naproxen (NAPROSYN) 375 MG tablet Take 1 tablet (375 mg total) by mouth 2 (two) times daily. 06/13/16   Hope Bunnie Pion, NP  omeprazole (PRILOSEC) 40 MG capsule Take 1 capsule (40 mg total) by mouth daily. 03/08/15   Araceli Bouche, PA  sertraline (ZOLOFT) 100 MG tablet Take 100 mg by mouth daily.    Historical Provider, MD   Social History   Social History  . Marital status: Widowed    Spouse name: N/A  . Number of children: N/A  . Years of education: N/A   Occupational History  . Not on file.   Social History Main Topics  . Smoking status: Current Every Day Smoker    Packs/day: 1.00    Years: 45.00  . Smokeless tobacco: Never Used  . Alcohol use No  . Drug use: No  . Sexual activity: No   Other Topics  Concern  . Not on file   Social History Narrative  . No narrative on file   Review of Systems  Respiratory: Positive for shortness of breath (no secondary coughing fits). Negative for cough.   Cardiovascular: Negative for chest pain.  Allergic/Immunologic: Positive for environmental allergies.  13 point ROS positive for the above and mentions it's due to depression and barretts. Reports her barette symptoms are being monitored. Objective:  Physical Exam  Constitutional: She is oriented to person, place, and time. She appears well-developed and well-nourished.  HENT:  Head: Normocephalic and atraumatic.  Right Ear: External ear normal.  Left Ear: External ear normal.  Mouth/Throat: Oropharynx is clear and moist.  Eyes: Conjunctivae are normal. Pupils are equal, round, and reactive to light.  Neck: Normal range of motion. Neck supple. No thyromegaly present.  Cardiovascular: Normal rate, regular rhythm, normal heart sounds and intact distal pulses.   No murmur heard. Pulmonary/Chest: Effort normal and breath sounds normal. No respiratory distress. She has no wheezes.  Abdominal: Soft. Bowel sounds are normal. There is no tenderness.  Musculoskeletal: Normal range of motion. She exhibits no edema or tenderness.  Lymphadenopathy:    She has no cervical adenopathy.  Neurological: She is alert and oriented to person, place, and time.  Skin: Skin is warm and dry. No rash noted.  Psychiatric: She has a normal mood and affect. Her behavior is normal. Thought content normal.  Vitals reviewed.  BP 130/84 (BP Location: Right Arm, Patient Position: Sitting, Cuff Size: Small)   Pulse 88   Temp 98 F (36.7 C) (Oral)   Resp 20   Ht 5' 1.5" (1.562 m)   Wt 105 lb 3.2 oz (47.7 kg)   SpO2 98%   BMI 19.56 kg/m    Assessment & Plan:   Jaslene Mosbey is a 65 y.o. female Annual physical exam  - -anticipatory guidance as below in AVS, screening labs above. Health maintenance items as above in HPI  discussed/recommended as applicable.  - Discussed Pap testing, declined at present. If she has is performed, then likely can be last test based on age.  -Borderline abnormal mammogram/ultrasound. Follow-up study planned in December, advised let me know if referral needed.  -History of shortness of breath, suspected panic attack/anxiety attack as patient described, but with her smoking history and deconditioning/lung limitations with stress testing, advised to follow-up to discuss these further. ER/911 precautions if acute worsening.   -Borderline blood pressure, advised to follow-up if over 140/90 as not currently on meds.  -Continue follow-up with psychiatrist regarding depression/anxiety.  Need for shingles vaccine - Plan: Zoster Vaccine Live, PF, (ZOSTAVAX) 60454 UNT/0.65ML injection  - Rx printed. Can check into cost at her pharmacy to have given if she desires  Tobacco abuse  -Importance of cessation was discussed including with family history of cancer. Resources provided if and when she is ready to quit. Depression, unspecified depression type  Allergic rhinitis, unspecified chronicity, unspecified seasonality, unspecified trigger  -Symptomatic care discussed.  Encounter for medication monitoring - Plan: Magnesium, Vitamin B12  -Due to chronic PPI use, will check magnesium, vitamin B12. Previously normal.  Screening for hyperlipidemia - Plan: Lipid panel  Screening for diabetes mellitus - Plan: COMPLETE METABOLIC PANEL WITH GFR   Meds ordered this encounter  Medications  . Zoster Vaccine Live, PF, (ZOSTAVAX) 09811 UNT/0.65ML injection    Sig: Inject 19,400 Units into the skin once.    Dispense:  1 each    Refill:  0   Patient Instructions    OK to hold off meds for now for blood pressure, but keep a record of your blood pressures outside of the office and if over 140/90 - return to discuss medicines.   You are overdue for pap testing - return to have that performed when  you are ready. If you have that testing performed, it may be the last test you need.   Follow up as planned for mammogram and ultrasound of breast. If they need an order from me, let me know and I will place that order.   [facility] offers smoking cessation clinics. Registration is required. To register call [phone] or register online at [email or web address] When you  are ready, call that number to help with quitting.   Follow-up with me in the next few months to discuss the shortness of breath symptoms as well as to discuss the results from your stress test last year indicating some possible lung cause of decreased exercise tolerance. Return to the clinic or go to the nearest emergency room if any of your symptoms worsen or new symptoms occur.  I will check some blood work today as we discussed in those results will come through your my chart account. Let me know if you have any questions in the meantime.  Keeping You Healthy  Get These Tests  Blood Pressure- Have your blood pressure checked by your healthcare provider at least once a year.  Normal blood pressure is 120/80.  Weight- Have your body mass index (BMI) calculated to screen for obesity.  BMI is a measure of body fat based on height and weight.  You can calculate your own BMI at [employer].it  Cholesterol- Have your cholesterol checked every year.  Diabetes- Have your blood sugar checked every year if you have high blood pressure, high cholesterol, a family history of diabetes or if you are overweight.  Pap Test - Have a pap test every 1 to 5 years if you have been sexually active.  If you are older than 65 and recent pap tests have been normal you may not need additional pap tests.  In addition, if you have had a hysterectomy  for benign disease additional pap tests are not necessary.  Mammogram-Yearly mammograms are essential for early detection of breast cancer  Screening for Colon Cancer- Colonoscopy starting at  age 50. Screening may begin sooner depending on your family history and other health conditions.  Follow up colonoscopy as directed by your Gastroenterologist.  Screening for Osteoporosis- Screening begins at age 17 with bone density scanning, sooner if you are at higher risk for developing Osteoporosis.  Get these medicines  Calcium with Vitamin D- Your body requires 1200-1500 mg of Calcium a day and [phone] IU of Vitamin D a day.  You can only absorb 500 mg of Calcium at a time therefore Calcium must be taken in 2 or 3 separate doses throughout the day.  Hormones- Hormone therapy has been associated with increased risk for certain cancers and heart disease.  Talk to your healthcare provider about if you need relief from menopausal symptoms.  Aspirin- Ask your healthcare provider about taking Aspirin to prevent Heart Disease and Stroke.  Get these Immuniztions  Flu shot- Every fall  Pneumonia shot- Once after the age of 66; if you are younger ask your healthcare provider if you need a pneumonia shot.  Tetanus- Every ten years.  Zostavax- Once after the age of 47 to prevent shingles.  Take these steps  Don't smoke- Your healthcare provider can help you quit. For tips on how to quit, ask your healthcare provider or go to www.smokefree.gov or call 1-800 QUIT-NOW.  Be physically active- Exercise 5 days a week for a minimum of 30 minutes.  If you are not already physically active, start slow and gradually work up to 30 minutes of moderate physical activity.  Try walking, dancing, bike riding, swimming, etc.  Eat a healthy diet- Eat a variety of healthy foods such as fruits, vegetables, whole grains, low fat milk, low fat cheeses, yogurt, lean meats, chicken, fish, eggs, dried beans, tofu, etc.  For more information go to www.thenutritionsource.org  Dental visit- Brush and floss teeth twice daily; visit your  dentist twice a year.  Eye exam- Visit your Optometrist or Ophthalmologist  yearly.  Drink alcohol in moderation- Limit alcohol intake to one drink or less a day.  Never drink and drive.  Depression- Your emotional health is as important as your physical health.  If you're feeling down or losing interest in things you normally enjoy, please talk to your healthcare provider.  Seat Belts- can save your life; always wear one  Smoke/Carbon Monoxide detectors- These detectors need to be installed on the appropriate level of your home.  Replace batteries at least once a year.  Violence- If anyone is threatening or hurting you, please tell your healthcare provider.  Living Will/ Health care power of attorney- Discuss with your healthcare provider and family.     IF you received an x-ray today, you will receive an invoice from [facility]. Please contact [facility] at [phone] with questions or concerns regarding your invoice.   IF you received labwork today, you will receive an invoice from [employer]. Please contact [facility] at [phone] with questions or concerns regarding your invoice.   Our billing staff will not be able to assist you with questions regarding bills from these companies.  You will be contacted with the lab results as soon as they are available. The fastest way to get your results is to activate your My Chart account. Instructions are located on the last page of this paperwork. If you have not heard from [place] regarding the results in 2 weeks, please contact this office.        I personally performed the services described in this documentation, which was scribed in my presence. The recorded information has been reviewed and considered, and addended by me as needed.   Signed,   [name], MD [facility].  10/20/16 9:45 PM

## 2016-10-20 LAB — LIPID PANEL
CHOL/HDL RATIO: 1.9 ratio (ref <5.0)
CHOLESTEROL: 208 mg/dL — AB (ref <200)
HDL: 107 mg/dL (ref >50)
LDL Cholesterol: 88 mg/dL
TRIGLYCERIDES: 67 mg/dL (ref <150)
VLDL: 13 mg/dL (ref <30)

## 2016-10-20 LAB — COMPLETE METABOLIC PANEL WITH GFR
ALT: 16 U/L (ref 6–29)
AST: 23 U/L (ref 10–35)
Albumin: 4.7 g/dL (ref 3.6–5.1)
Alkaline Phosphatase: 66 U/L (ref 33–130)
BUN: 5 mg/dL — ABNORMAL LOW (ref 7–25)
CALCIUM: 9.9 mg/dL (ref 8.6–10.4)
CHLORIDE: 96 mmol/L — AB (ref 98–110)
CO2: 25 mmol/L (ref 20–31)
Creat: 0.69 mg/dL (ref 0.50–0.99)
Glucose, Bld: 88 mg/dL (ref 65–99)
POTASSIUM: 4.5 mmol/L (ref 3.5–5.3)
Sodium: 131 mmol/L — ABNORMAL LOW (ref 135–146)
Total Bilirubin: 0.5 mg/dL (ref 0.2–1.2)
Total Protein: 7.4 g/dL (ref 6.1–8.1)

## 2016-10-20 LAB — MAGNESIUM: Magnesium: 2.2 mg/dL (ref 1.5–2.5)

## 2016-10-20 LAB — VITAMIN B12: Vitamin B-12: 439 pg/mL (ref 200–1100)

## 2016-10-26 ENCOUNTER — Other Ambulatory Visit: Payer: Self-pay | Admitting: Family Medicine

## 2016-10-26 DIAGNOSIS — N6489 Other specified disorders of breast: Secondary | ICD-10-CM

## 2016-10-26 DIAGNOSIS — N63 Unspecified lump in unspecified breast: Secondary | ICD-10-CM

## 2016-12-26 ENCOUNTER — Telehealth: Payer: Self-pay | Admitting: Family Medicine

## 2016-12-26 ENCOUNTER — Telehealth (INDEPENDENT_AMBULATORY_CARE_PROVIDER_SITE_OTHER): Payer: Self-pay

## 2016-12-26 NOTE — Telephone Encounter (Signed)
Pt would like for you to know that her brother Avelino Leeds had passed away on 03-12-23 he had pneumonia and dismissed him and had to return back to hospital the next day and that's when he passed

## 2016-12-26 NOTE — Telephone Encounter (Signed)
Patient stated she heard or felt a crack in her lower back on last Sunday.  Been having back spasms.  Stated she has been using a heating pad to help with the pain.  Had an injection about 5 months ago.  Would like to be seen by Dr. Ernestina Patches?  Her call back number is [phone].

## 2016-12-27 ENCOUNTER — Ambulatory Visit (INDEPENDENT_AMBULATORY_CARE_PROVIDER_SITE_OTHER): Payer: BLUE CROSS/BLUE SHIELD | Admitting: Physical Medicine and Rehabilitation

## 2016-12-27 NOTE — Telephone Encounter (Signed)
ok 

## 2016-12-29 ENCOUNTER — Ambulatory Visit (INDEPENDENT_AMBULATORY_CARE_PROVIDER_SITE_OTHER): Payer: Self-pay

## 2016-12-29 ENCOUNTER — Ambulatory Visit (INDEPENDENT_AMBULATORY_CARE_PROVIDER_SITE_OTHER): Payer: BLUE CROSS/BLUE SHIELD | Admitting: Physical Medicine and Rehabilitation

## 2016-12-29 ENCOUNTER — Encounter (INDEPENDENT_AMBULATORY_CARE_PROVIDER_SITE_OTHER): Payer: Self-pay | Admitting: Physical Medicine and Rehabilitation

## 2016-12-29 VITALS — BP 132/74 | HR 93

## 2016-12-29 DIAGNOSIS — M47816 Spondylosis without myelopathy or radiculopathy, lumbar region: Secondary | ICD-10-CM | POA: Diagnosis not present

## 2016-12-29 DIAGNOSIS — M545 Low back pain, unspecified: Secondary | ICD-10-CM

## 2016-12-29 MED ORDER — BACLOFEN 10 MG PO TABS: ORAL_TABLET | ORAL | 0 refills | Status: DC

## 2016-12-29 MED ORDER — PREDNISONE 50 MG PO TABS: ORAL_TABLET | ORAL | 0 refills | Status: DC

## 2016-12-29 NOTE — Patient Instructions (Signed)
Ask Dr. Carlota Raspberry, PCP about bone density test.   Facet Syndrome Facet syndrome is a condition where injury to the small joints between the bones in the spine (facet joints) causes back pain. Over rotation (twisting) or arching (extension) of the back may injure the joints or the soft disks between the spinal bones. Such injuries result in excessive motion of the facet joint. This causes the cartilage covering the facet joint to wear down. That places pressure on nerves, as they exit the spinal cord.  SYMPTOMS   Chronic dull ache in the low back, that gets worse with over-extension and rotation.  Pain in the low back, buttocks, hip, and sometimes leg.  Sometimes, stiffness of the low back. CAUSES  Facet syndrome is often caused by repeated or over rotation, over-extension, or extension with rotation of the back. These motions cause injury to the cartilage covering the facet joints. This places pressure on the spinal nerves. RISK INCREASES WITH:  Sports that can cause over-extension of the back, with rotation or repeatedly (golf, football, gymnastics, diving, weight-lifting, dancing, rifle shooting, wrestling, tennis, swimming, volleyball, track and field, rugby, other contact sports).  Poor back strength and flexibility.  Poor exercise technique. PREVENTION   Learn and use proper technique.  Warm up and stretch properly before activity.  Maintain physical fitness:  Back and hamstring flexibility.  Back muscle strength and endurance.  Cardiovascular fitness. PROGNOSIS  This condition is often resolved with proper non-surgical treatment.  RELATED COMPLICATIONS   Recurring symptoms, resulting in a chronic problem.  Delayed healing, especially if sports are resumed too soon.  Prolonged impairment.  Narrowed canal for the spinal cord, due to bone spurs (bumps) resulting from chronic erosion of the facet joints (spinal stenosis). TREATMENT  Treatment first involves stopping  activities that aggravate your symptoms. Ice and medicines may be used to reduce pain and inflammation. Your caregiver may advise strength and stretching activities, to be completed at home or with a therapist. You may be referred to a physical therapist for further treatment, including: ultrasound, manual adjustments, transcutaneous electronic nerve stimulation (TENS). Surgery is rarely needed. It is reserved for athletes with persistent pain, despite 6 to 12 months of proper non-surgical treatment. Surgery involves joining (fusing) two bones of the spinal column, to stop motion between the facet joint and disk. MEDICATION   If pain medicine is needed, nonsteroidal anti-inflammatory medicines (aspirin and ibuprofen), or other minor pain relievers (acetaminophen), are often advised.  Do not take pain medicine for 7 days before surgery.  Stronger pain relievers may be prescribed. Use only as directed and only as much as you need. HEAT AND COLD  Cold treatment (icing) relieves pain and reduces inflammation. Cold treatment should be applied for 10 to 15 minutes every 2 to 3 hours, and immediately after activity that aggravates your symptoms. Use ice packs or an ice massage.  Heat treatment may be used before performing stretching and strengthening activities advised by your caregiver, physical therapist, or athletic trainer. Use a heat pack or a warm water soak. SEEK MEDICAL CARE IF:   Symptoms get worse or do not improve in 2 to 4 weeks, despite treatment.  You develop numbness, weakness, or loss of bladder or bowel function.  New, unexplained symptoms develop. (Drugs used in treatment may produce side effects.) This information is not intended to replace advice given to you by your health care provider. Make sure you discuss any questions you have with your health care provider. Document Released: 11/27/2005 Document  Revised: 02/19/2012 Document Reviewed: 08/09/2015 Elsevier Interactive Patient  Education  2017 [employer].

## 2016-12-29 NOTE — Telephone Encounter (Signed)
Called pt - expressed condolences on loss of her brother. No current needs identified, but advised her to let me know if there is anything we can do to help during this time.

## 2016-12-29 NOTE — Progress Notes (Signed)
Sheryl Garrett - 66 y.o. female MRN [id number]  Date of birth: [birth year]  Office Visit Note: Visit Date: 12/29/2016 PCP: No PCP Per Patient Referred by: No ref. provider found  Subjective: Chief Complaint  Patient presents with  . Lower Back - Pain   HPI: Sheryl Garrett is a 66 year old female that we seen on a few occasions and is well documented. She had primarily been treated by me for thoracic pain. 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 Epidural injection of the thoracic spine has helped her quite a bit. Her case is complicated by multilevel facet arthropathy and degenerative changes and complicated by long-term history of smoking. She comes in today stating that on 12/17/2016 she was picking up her dog put him in the car and she heard a crack across her lower back and began having spasms in the back. She's had no numbness or tingling or paresthesias in the feet or legs. She has not had any spasming in the leg but in the lower back. She's had some difficulty trying to get to sleep. She has not had any focal weakness. She's had some difficulty straightening up. She initially used heat as well as rest. She also had some pain medication which is hydrocodone 10 mg from a dental procedure. She's been taking a half of one of those and has helped to some degree. She has not had any x-rays or other evaluation. We have not had prior lumbar spine MRI recently. She has a chronic long-term history of low back pain. She's had no bowel or bladder changes and no focal weakness.    Lower back pain. 12/17/16 Heard a crack across bottom of back trying to get a dog in the car. Spasms in lower back. No numbness or tingling. Difficulty sleeping. Review of Systems  Constitutional: Negative for chills, fever, malaise/fatigue and weight loss.  HENT: Negative for hearing loss and sinus pain.   Eyes: Negative for blurred vision, double vision and  photophobia.  Respiratory: Positive for cough. Negative for shortness of breath.   Cardiovascular: Negative for chest pain, palpitations and leg swelling.  Gastrointestinal: Negative for abdominal pain, nausea and vomiting.  Genitourinary: Negative for flank pain.  Musculoskeletal: Positive for back pain. Negative for myalgias.  Skin: Negative for itching and rash.  Neurological: Negative for tremors, focal weakness and weakness.  Endo/Heme/Allergies: Negative.   Psychiatric/Behavioral: Negative for depression.  All other systems reviewed and are negative.  Otherwise per HPI.  Assessment & Plan: Visit Diagnoses:  1. Acute midline low back pain without sciatica   2. Spondylosis without myelopathy or radiculopathy, lumbar region     Plan: Findings:  Exacerbation of chronic low back pain which appears to have been more of a facet mediated acute pain probably from twisting and lifting. More of a sprain strain type of activity. Could've had a small tear in the disc but it sounds more facet mediated with her. She has had difficulty getting comfortable at night. I think the best approach is prednisone for the next few days as well as a muscle relaxer which I would use baclofen. These were prescribed today. I've also told her to be up moving around and into look at activity modification and relative rest but to not resting much that she needs to be moving. If this doesn't help over time that we would further evaluated with either MRI if it seemed to get worse versus a one-time  epidural injection versus facet joint block. If her symptoms calmed down some and is just mild and chronic we would look at refocusing with physical therapy. X-rays were completed today and are detailed in the note. I spent more than 20 minutes speaking face-to-face with the patient with 50% of the time in counseling.    Meds & Orders:  Meds ordered this encounter  Medications  . baclofen (LIORESAL) 10 MG tablet    Sig: Take  1/2 to 1 by mouth every 8hrs as needed for spasm    Dispense:  60 tablet    Refill:  0  . predniSONE (DELTASONE) 50 MG tablet    Sig: Take 1 tablet daily with food for 5 days until finished    Dispense:  5 tablet    Refill:  0    Orders Placed This Encounter  Procedures  . XR Lumbar Spine 2-3 Views    Follow-up: Return in about 4 weeks (around 01/26/2017), or if symptoms worsen or fail to improve.   Procedures: No procedures performed  No notes on file   Clinical History: No specialty comments available.  She reports that she has been smoking.  She has a 45.00 pack-year smoking history. She has never used smokeless tobacco. No results for input(s): HGBA1C, LABURIC in the last 8760 hours.  Objective:  VS:  HT:    WT:   BMI:     BP:132/74  HR:93bpm  TEMP: ( )  RESP:  Physical Exam  Constitutional: She is oriented to person, place, and time. She appears well-developed and well-nourished.  Eyes: Conjunctivae and EOM are normal. Pupils are equal, round, and reactive to light.  Cardiovascular: Normal rate and intact distal pulses.   Pulmonary/Chest: Effort normal.  Musculoskeletal:  Patient is slow to rise from a seated position and has concordant pain with extension rotation of the lumbar spine. She has no pain with rocking over the vertebral bodies. She has some increased kyphosis. She has no pain with hip rotation internal or external. She has good distal strength without clonus. She has 2+ muscle stretch reflexes at the quadriceps and gastrocnemius.  Neurological: She is alert and oriented to person, place, and time. She displays normal reflexes. She exhibits normal muscle tone. Coordination normal.  Skin: Skin is warm and dry. No rash noted. No erythema.  Psychiatric: She has a normal mood and affect. Her behavior is normal.  Nursing note and vitals reviewed.   Ortho Exam Imaging: No results found.  Past Medical/Family/Surgical/Social History: Medications & Allergies  reviewed per EMR Patient Active Problem List   Diagnosis Date Noted  . 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 GERD (gastroesophageal reflux disease) 05/25/2015  . Colon adenomas 05/25/2015  . Diverticulosis of colon without hemorrhage 05/25/2015  . Barrett esophagus 03/08/2015  . Tobacco use disorder 03/08/2015  . Essential hypertension 03/08/2015  . Grief 03/08/2015   Past Medical History:  Diagnosis Date  . GERD (gastroesophageal reflux disease)   . Hypertension    Family History  Problem Relation Age of Onset  . Drug abuse Daughter    Past Surgical History:  Procedure Laterality Date  . EYE SURGERY     eyelid turned inward on right eye   Social History   Occupational History  . Not on file.   Social History Main Topics  . Smoking status: Current Every Day Smoker    Packs/day: 1.00    Years: 45.00  . Smokeless tobacco: Never Used  . Alcohol use No  . Drug use:  No  . Sexual activity: No

## 2017-01-02 ENCOUNTER — Other Ambulatory Visit: Payer: Self-pay | Admitting: Physician Assistant

## 2017-01-02 DIAGNOSIS — J309 Allergic rhinitis, unspecified: Secondary | ICD-10-CM

## 2017-01-05 ENCOUNTER — Ambulatory Visit (INDEPENDENT_AMBULATORY_CARE_PROVIDER_SITE_OTHER): Payer: BLUE CROSS/BLUE SHIELD | Admitting: Family Medicine

## 2017-01-05 ENCOUNTER — Ambulatory Visit (INDEPENDENT_AMBULATORY_CARE_PROVIDER_SITE_OTHER): Payer: BLUE CROSS/BLUE SHIELD

## 2017-01-05 VITALS — BP 108/70 | HR 92 | Temp 98.3°F | Resp 17 | Ht 61.5 in | Wt 106.0 lb

## 2017-01-05 DIAGNOSIS — R05 Cough: Secondary | ICD-10-CM

## 2017-01-05 DIAGNOSIS — J22 Unspecified acute lower respiratory infection: Secondary | ICD-10-CM

## 2017-01-05 DIAGNOSIS — R059 Cough, unspecified: Secondary | ICD-10-CM

## 2017-01-05 DIAGNOSIS — R062 Wheezing: Secondary | ICD-10-CM | POA: Diagnosis not present

## 2017-01-05 MED ORDER — BENZONATATE 100 MG PO CAPS: 100.0000 mg | ORAL_CAPSULE | Freq: Three times a day (TID) | ORAL | 0 refills | Status: DC | PRN

## 2017-01-05 MED ORDER — ALBUTEROL SULFATE HFA 108 (90 BASE) MCG/ACT IN AERS: 2.0000 | INHALATION_SPRAY | Freq: Four times a day (QID) | RESPIRATORY_TRACT | 0 refills | Status: DC | PRN

## 2017-01-05 MED ORDER — AZITHROMYCIN 250 MG PO TABS: ORAL_TABLET | ORAL | 0 refills | Status: DC

## 2017-01-05 NOTE — Progress Notes (Signed)
Subjective:  By signing my name below, I, Sheryl Garrett, attest that this documentation has been prepared under the direction and in the presence of Sheryl Ray, MD.  Electronically Signed: Thea Alken, ED Scribe. 01/05/2017. 1:26 PM.   Patient ID: Sheryl Garrett, female    DOB: 1951-06-29, 66 y.o.   MRN: [id number]  HPI Chief Complaint  Patient presents with  . Cough    Onset 2 weeks  . Nasal Congestion    In chest especially at night    HPI Comments: Sheryl Garrett is a 66 y.o. female who presents to the Primary Care at [facility] complaining of worsening dry cough for the past 2 weeks. Hx of tobacco abuse. She reports associated chest congestion, but is unable to produce mucous. Recently, pt finsihed penicillin 4 times a day off and on for the past 4 weeks for a cracked tooth and prednisone 2 days ago for back pain. She reports inhaler use several years ago. She denies fever.   Patient Active Problem List   Diagnosis Date Noted  . 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 GERD (gastroesophageal reflux disease) 05/25/2015  . Colon adenomas 05/25/2015  . Diverticulosis of colon without hemorrhage 05/25/2015  . Barrett esophagus 03/08/2015  . Tobacco use disorder 03/08/2015  . Essential hypertension 03/08/2015  . Grief 03/08/2015   Past Medical History:  Diagnosis Date  . GERD (gastroesophageal reflux disease)   . Hypertension    Past Surgical History:  Procedure Laterality Date  . EYE SURGERY     eyelid turned inward on right eye   No Known Allergies Prior to Admission medications   Medication Sig Start Date End Date Taking? Authorizing Provider  ALPRAZolam Duanne Moron) 1 MG tablet Take 1 mg by mouth 4 (four) times daily as needed for anxiety.   Yes Historical Provider, MD  omeprazole (PRILOSEC) 40 MG capsule Take 1 capsule (40 mg total) by mouth daily. 03/08/15  Yes Todd McVeigh, PA  sertraline (ZOLOFT) 100 MG tablet Take 100 mg by mouth daily.   Yes Historical Provider, MD   Social History   Social  History  . Marital status: Widowed    Spouse name: N/A  . Number of children: N/A  . Years of education: N/A   Occupational History  . Not on file.   Social History Main Topics  . Smoking status: Current Every Day Smoker    Packs/day: 1.00    Years: 45.00  . Smokeless tobacco: Never Used  . Alcohol use No  . Drug use: No  . Sexual activity: No   Other Topics Concern  . Not on file   Social History Narrative  . No narrative on file   Review of Systems  Constitutional: Positive for chills and fatigue. Negative for fever.  Respiratory: Positive for cough. Negative for shortness of breath.     Objective:   Physical Exam  Constitutional: She is oriented to person, place, and time. She appears well-developed and well-nourished. No distress.  HENT:  Head: Normocephalic and atraumatic.  Eyes: Conjunctivae and EOM are normal.  Neck: Neck supple.  Cardiovascular: Normal rate, regular rhythm and normal heart sounds.   No murmur heard. Pulmonary/Chest: Effort normal. She has wheezes ( possible, faint). She has no rales.  Scatter coarse breath sounds left lower lobe.   Musculoskeletal: Normal range of motion.  Neurological: She is alert and oriented to person, place, and time.  Skin: Skin is warm and dry.  Psychiatric: She has a normal mood and affect. Her behavior is  normal.  Nursing note and vitals reviewed.  Vitals:   01/05/17 1247  BP: 108/70  Pulse: 92  Resp: 17  Temp: 98.3 F (36.8 C)  TempSrc: Oral  SpO2: 96%  Weight: 106 lb (48.1 kg)  Height: 5' 1.5" (1.562 m)   Dg Chest 2 View  Result Date: 01/05/2017 CLINICAL DATA:  Cough for 2 weeks EXAM: CHEST  2 VIEW COMPARISON:  None. FINDINGS: There is hyperinflation of the lungs compatible with COPD. Heart and mediastinal contours are within normal limits. No focal opacities or effusions. No acute bony abnormality. IMPRESSION: COPD.  No active disease. Electronically Signed   By: Rolm Baptise M.D.   On: 01/05/2017 14:09     Assessment & Plan:    Sheryl Garrett is a 66 y.o. female Cough - Plan: DG Chest 2 View, benzonatate (TESSALON) 100 MG capsule  LRTI (lower respiratory tract infection) - Plan: azithromycin (ZITHROMAX) 250 MG tablet  Wheezing - Plan: albuterol (PROVENTIL HFA;VENTOLIN HFA) 108 (90 Base) MCG/ACT inhaler  Persistent cough, suspected lower respiratory infection versus COPD flare.  -Start azithromycin.  -Tessalon Perles 3 times a day when necessary  -Start with albuterol when necessary but if persistent wheeze or frequent use of albuterol, may need prednisone. Also recommended follow-up for spirometry, but with smoking history and x-Garrett results, likely component of COPD, and depending on symptoms consider Spiriva.   -RTC precautions.  Meds ordered this encounter  Medications  . azithromycin (ZITHROMAX) 250 MG tablet    Sig: Take 2 pills by mouth on day 1, then 1 pill by mouth per day on days 2 through 5.    Dispense:  6 tablet    Refill:  0  . albuterol (PROVENTIL HFA;VENTOLIN HFA) 108 (90 Base) MCG/ACT inhaler    Sig: Inhale 2 puffs into the lungs every 6 (six) hours as needed for wheezing or shortness of breath.    Dispense:  1 Inhaler    Refill:  0  . benzonatate (TESSALON) 100 MG capsule    Sig: Take 1 capsule (100 mg total) by mouth 3 (three) times daily as needed for cough.    Dispense:  20 capsule    Refill:  0   Patient Instructions     Possible bronchitis versus early community-acquired pneumonia. You do have some signs of COPD on your chest x-Garrett, so would like to have you return within the next month or 2 to do some lung function testing. For current cough, take Tessalon Perles up to 3 times per day, albuterol inhaler up to every 4-6 hours if needed for wheezing, and start antibiotic. If not significantly improved by Monday, return for recheck, otherwise to the emergency room if any worsening sooner. If you are requiring inhaler more than 1-2 times per day, or sooner than every  4 hours, return here or the emergency room.    Cough, Adult Coughing is a reflex that clears your throat and your airways. Coughing helps to heal and protect your lungs. It is normal to cough occasionally, but a cough that happens with other symptoms or lasts a long time may be a sign of a condition that needs treatment. A cough may last only 2-3 weeks (acute), or it may last longer than 8 weeks (chronic). What are the causes? Coughing is commonly caused by:  Breathing in substances that irritate your lungs.  A viral or bacterial respiratory infection.  Allergies.  Asthma.  Postnasal drip.  Smoking.  Acid backing up from the stomach into the  esophagus (gastroesophageal reflux).  Certain medicines.  Chronic lung problems, including COPD (or rarely, lung cancer).  Other medical conditions such as heart failure. Follow these instructions at home: Pay attention to any changes in your symptoms. Take these actions to help with your discomfort:  Take medicines only as told by your health care provider.  If you were prescribed an antibiotic medicine, take it as told by your health care provider. Do not stop taking the antibiotic even if you start to feel better.  Talk with your health care provider before you take a cough suppressant medicine.  Drink enough fluid to keep your urine clear or pale yellow.  If the air is dry, use a cold steam vaporizer or humidifier in your bedroom or your home to help loosen secretions.  Avoid anything that causes you to cough at work or at home.  If your cough is worse at night, try sleeping in a semi-upright position.  Avoid cigarette smoke. If you smoke, quit smoking. If you need help quitting, ask your health care provider.  Avoid caffeine.  Avoid alcohol.  Rest as needed. Contact a health care provider if:  You have new symptoms.  You cough up pus.  Your cough does not get better after 2-3 weeks, or your cough gets worse.  You  cannot control your cough with suppressant medicines and you are losing sleep.  You develop pain that is getting worse or pain that is not controlled with pain medicines.  You have a fever.  You have unexplained weight loss.  You have night sweats. Get help right away if:  You cough up blood.  You have difficulty breathing.  Your heartbeat is very fast. This information is not intended to replace advice given to you by your health care provider. Make sure you discuss any questions you have with your health care provider. Document Released: 05/26/2011 Document Revised: 05/04/2016 Document Reviewed: 02/03/2015 Elsevier Interactive Patient Education  2017 [employer].     IF you received an x-Garrett today, you will receive an invoice from [facility]. Please contact [facility] at [phone] with questions or concerns regarding your invoice.   IF you received labwork today, you will receive an invoice from [facility]. Please contact [facility] at [phone] with questions or concerns regarding your invoice.   Our billing staff will not be able to assist you with questions regarding bills from these companies.  You will be contacted with the lab results as soon as they are available. The fastest way to get your results is to activate your My Chart account. Instructions are located on the last page of this paperwork. If you have not heard from [place] regarding the results in 2 weeks, please contact this office.       I personally performed the services described in this documentation, which was scribed in my presence. The recorded information has been reviewed and considered, and addended by me as needed.   Signed,   Sheryl Ray, MD [facility] at [place].  01/07/17 4:02 PM

## 2017-01-05 NOTE — Patient Instructions (Signed)
Possible bronchitis versus early community-acquired pneumonia. You do have some signs of COPD on your chest x-ray, so would like to have you return within the next month or 2 to do some lung function testing. For current cough, take Tessalon Perles up to 3 times per day, albuterol inhaler up to every 4-6 hours if needed for wheezing, and start antibiotic. If not significantly improved by Monday, return for recheck, otherwise to the emergency room if any worsening sooner. If you are requiring inhaler more than 1-2 times per day, or sooner than every 4 hours, return here or the emergency room.    Cough, Adult Coughing is a reflex that clears your throat and your airways. Coughing helps to heal and protect your lungs. It is normal to cough occasionally, but a cough that happens with other symptoms or lasts a long time may be a sign of a condition that needs treatment. A cough may last only 2-3 weeks (acute), or it may last longer than 8 weeks (chronic). What are the causes? Coughing is commonly caused by:  Breathing in substances that irritate your lungs.  A viral or bacterial respiratory infection.  Allergies.  Asthma.  Postnasal drip.  Smoking.  Acid backing up from the stomach into the esophagus (gastroesophageal reflux).  Certain medicines.  Chronic lung problems, including COPD (or rarely, lung cancer).  Other medical conditions such as heart failure. Follow these instructions at home: Pay attention to any changes in your symptoms. Take these actions to help with your discomfort:  Take medicines only as told by your health care provider.  If you were prescribed an antibiotic medicine, take it as told by your health care provider. Do not stop taking the antibiotic even if you start to feel better.  Talk with your health care provider before you take a cough suppressant medicine.  Drink enough fluid to keep your urine clear or pale yellow.  If the air is dry, use a cold  steam vaporizer or humidifier in your bedroom or your home to help loosen secretions.  Avoid anything that causes you to cough at work or at home.  If your cough is worse at night, try sleeping in a semi-upright position.  Avoid cigarette smoke. If you smoke, quit smoking. If you need help quitting, ask your health care provider.  Avoid caffeine.  Avoid alcohol.  Rest as needed. Contact a health care provider if:  You have new symptoms.  You cough up pus.  Your cough does not get better after 2-3 weeks, or your cough gets worse.  You cannot control your cough with suppressant medicines and you are losing sleep.  You develop pain that is getting worse or pain that is not controlled with pain medicines.  You have a fever.  You have unexplained weight loss.  You have night sweats. Get help right away if:  You cough up blood.  You have difficulty breathing.  Your heartbeat is very fast. This information is not intended to replace advice given to you by your health care provider. Make sure you discuss any questions you have with your health care provider. Document Released: 05/26/2011 Document Revised: 05/04/2016 Document Reviewed: 02/03/2015 Elsevier Interactive Patient Education  2017 [employer].     IF you received an x-ray today, you will receive an invoice from [facility] Radiology. Please contact [facility]iology at [phone] with questions or concerns regarding your invoice.   IF you received labwork today, you will receive an invoice from [place]. Please contact  [facility] at [phone] with questions or concerns regarding your invoice.   Our billing staff will not be able to assist you with questions regarding bills from these companies.  You will be contacted with the lab results as soon as they are available. The fastest way to get your results is to activate your My Chart account. Instructions are located on the last page of this paperwork. If you  have not heard from [place] regarding the results in 2 weeks, please contact this office.

## 2017-01-25 ENCOUNTER — Ambulatory Visit: Payer: BLUE CROSS/BLUE SHIELD | Admitting: Family Medicine

## 2017-01-25 ENCOUNTER — Ambulatory Visit (INDEPENDENT_AMBULATORY_CARE_PROVIDER_SITE_OTHER): Payer: BLUE CROSS/BLUE SHIELD | Admitting: Physical Medicine and Rehabilitation

## 2017-02-28 ENCOUNTER — Other Ambulatory Visit: Payer: BLUE CROSS/BLUE SHIELD

## 2017-03-08 ENCOUNTER — Ambulatory Visit (INDEPENDENT_AMBULATORY_CARE_PROVIDER_SITE_OTHER): Payer: BLUE CROSS/BLUE SHIELD | Admitting: Family Medicine

## 2017-03-08 ENCOUNTER — Telehealth: Payer: Self-pay | Admitting: Family Medicine

## 2017-03-08 ENCOUNTER — Encounter: Payer: Self-pay | Admitting: Family Medicine

## 2017-03-08 VITALS — BP 130/74 | HR 77 | Temp 98.1°F | Ht 61.5 in | Wt 109.0 lb

## 2017-03-08 DIAGNOSIS — R0600 Dyspnea, unspecified: Secondary | ICD-10-CM | POA: Diagnosis not present

## 2017-03-08 DIAGNOSIS — Z720 Tobacco use: Secondary | ICD-10-CM

## 2017-03-08 DIAGNOSIS — Z122 Encounter for screening for malignant neoplasm of respiratory organs: Secondary | ICD-10-CM | POA: Diagnosis not present

## 2017-03-08 DIAGNOSIS — R6889 Other general symptoms and signs: Secondary | ICD-10-CM | POA: Diagnosis not present

## 2017-03-08 DIAGNOSIS — R9389 Abnormal findings on diagnostic imaging of other specified body structures: Secondary | ICD-10-CM

## 2017-03-08 DIAGNOSIS — R938 Abnormal findings on diagnostic imaging of other specified body structures: Secondary | ICD-10-CM | POA: Diagnosis not present

## 2017-03-08 MED ORDER — TIOTROPIUM BROMIDE MONOHYDRATE 18 MCG IN CAPS: 18.0000 ug | ORAL_CAPSULE | Freq: Every day | RESPIRATORY_TRACT | 5 refills | Status: DC

## 2017-03-08 NOTE — Telephone Encounter (Signed)
[employer] denied auth for CT lung cancer screening. If provider would like to do a peer-to-peer, we can call [phone] to do so.

## 2017-03-08 NOTE — Patient Instructions (Addendum)
  We can check thyroid test next time we draw bloodwork if you would like, but it was normal when checked in 2016.   Shortness of breath episodes may be due to anxiety, so would discuss that with your psychiatric care provider. However you do have some signs of COPD on your chest x-ray. Can try Spiriva 1 puff once per day to see if that lessens the episodes of shortness of breath. You can also use albuterol if needed for episodes of shortness of breath or wheezing. Recheck in the next 3 months to see how that is going.   I will order the ct scan for lung cancer screening.    IF you received an x-ray today, you will receive an invoice from [facility] Radiology. Please contact [facility]iology at [phone] with questions or concerns regarding your invoice.   IF you received labwork today, you will receive an invoice from [place]. Please contact [facility] at [phone] with questions or concerns regarding your invoice.   Our billing staff will not be able to assist you with questions regarding bills from these companies.  You will be contacted with the lab results as soon as they are available. The fastest way to get your results is to activate your My Chart account. Instructions are located on the last page of this paperwork. If you have not heard from [place] regarding the results in 2 weeks, please contact this office.

## 2017-03-08 NOTE — Progress Notes (Signed)
By signing my name below, I, Mesha Guinyard, attest that this documentation has been prepared under the direction and in the presence of [name], MD.  Electronically Signed: Verlee Monte, Medical Scribe. 03/08/17. 1:54 PM.  Subjective:    Patient ID: Sheryl Garrett, female    DOB: May 31, 1951, 66 y.o.   MRN: [id number]  HPI Chief Complaint  Patient presents with  . Follow-up    89mth f/u    HPI Comments: Sheryl Garrett is a 66 y.o. female with a PMHx of HTN, tobacco abuse, GERD, anxiety/grief and depression sxs who presents to the Primary Care at [facility] for follow-up.  SOB/Anxiety: Followed by [facility]. 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 Findings suggested COPD in CXR in Jan. Pt reports recent SOB when feeling anxious and isn't sure if could also be from being out of shape. She's also noticed she "holds her breath" without noticing and isn't sure if it's occuring because she's more aware of COPD/lung CA after the recent cancer deaths from friends and family. She reports having an irregular heart beat all her life, always being cold since moving to [place], but felt same when thyroid testing nl in past. She has gained her weight back after her husbands death and mentions life is good right now. Denies chest pain, wheezing, or SOB while walking up the stairs.  HTN: She's off of medications. We decided to hold off of medications with borderline readings. Monitor outside of office. Lab Results  Component Value Date   CREATININE 0.69 10/19/2016   Tobacco Abuse: Cessation discussed at last visit. She now smokes 10 cigarettes a day and has been smoking since she was a teenager. She also notices getting bronchitis frequently. Pt's sister died from a cancer that started in her pelvis.  GERD w/barrettes esophagus: Takes prilosec 40 mg QD. She had a nl magnesium and B12. She takes it when  she remembers and her last episode was last night with "severe indigestion".  Patient Active Problem List   Diagnosis Date Noted  . 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 GERD (gastroesophageal reflux disease) 05/25/2015  . Colon adenomas 05/25/2015  . Diverticulosis of colon without hemorrhage 05/25/2015  . Barrett esophagus 03/08/2015  . Tobacco use disorder 03/08/2015  . Essential hypertension 03/08/2015  . Grief 03/08/2015   Past Medical History:  Diagnosis Date  . GERD (gastroesophageal reflux disease)   . Hypertension    Past Surgical History:  Procedure Laterality Date  . EYE SURGERY     eyelid turned inward on right eye   No Known Allergies Prior to Admission medications   Medication Sig Start Date End Date Taking? Authorizing Provider  omeprazole (PRILOSEC) 40 MG capsule Take 1 capsule (40 mg total) by mouth daily. 03/08/15  Yes Todd McVeigh, PA  sertraline (ZOLOFT) 100 MG tablet Take 100 mg by mouth daily.   Yes Historical Provider, MD  albuterol (PROVENTIL HFA;VENTOLIN HFA) 108 (90 Base) MCG/ACT inhaler Inhale 2 puffs into the lungs every 6 (six) hours as needed for wheezing or shortness of breath. Patient not taking: Reported on 03/08/2017 01/05/17   Wendie Agreste, MD  ALPRAZolam Duanne Moron) 1 MG tablet Take 1 mg by mouth 4 (four) times daily as needed for anxiety.    Historical Provider, MD  azithromycin (ZITHROMAX) 250 MG tablet Take 2 pills by mouth on day 1, then 1 pill by mouth per day on days 2 through 5. Patient  not taking: Reported on 03/08/2017 01/05/17   Wendie Agreste, MD  benzonatate (TESSALON) 100 MG capsule Take 1 capsule (100 mg total) by mouth 3 (three) times daily as needed for cough. Patient not taking: Reported on 03/08/2017 01/05/17   Wendie Agreste, MD   Social History   Social History  . Marital status: Widowed    Spouse name: N/A  . Number of children: N/A  . Years of education: N/A   Occupational History  . Not on file.   Social History Main Topics  .  Smoking status: Current Every Day Smoker    Packs/day: 1.00    Years: 45.00  . Smokeless tobacco: Never Used  . Alcohol use No  . Drug use: No  . Sexual activity: No   Other Topics Concern  . Not on file   Social History Narrative  . No narrative on file   Review of Systems  Constitutional: Negative for unexpected weight change.  Respiratory: Positive for shortness of breath. Negative for wheezing.   Cardiovascular: Negative for chest pain.  Psychiatric/Behavioral: The patient is nervous/anxious.    Objective:  Physical Exam  Constitutional: She is oriented to person, place, and time. She appears well-developed and well-nourished.  HENT:  Head: Normocephalic and atraumatic.  Eyes: Conjunctivae and EOM are normal. Pupils are equal, round, and reactive to light.  Neck: Carotid bruit is not present.  Cardiovascular: Normal rate, regular rhythm, normal heart sounds and intact distal pulses.  Exam reveals no gallop and no friction rub.   No murmur heard. Pulmonary/Chest: Effort normal and breath sounds normal. No respiratory distress. She has no wheezes. She has no rales.  Abdominal: Soft. She exhibits no pulsatile midline mass. There is no tenderness.  Neurological: She is alert and oriented to person, place, and time.  Skin: Skin is warm and dry.  Psychiatric: She has a normal mood and affect. Her behavior is normal.  Vitals reviewed.   Vitals:   03/08/17 1329  BP: 130/74  Pulse: 77  Temp: 98.1 F (36.7 C)  TempSrc: Oral  SpO2: 96%  Weight: 109 lb (49.4 kg)  Height: 5' 1.5" (1.562 m)  Body mass index is 20.26 kg/m.   [EKG Reading]: Sinus rhythm. Rate 65. No acute findings but low voltage.   [Spirometry]: Mild obstruction with FVC 98% FEV1 89%, FEV1'FVC 90%  Assessment & Plan:   Sheryl Garrett is a 66 y.o. female Dyspnea, unspecified type - Plan: Spirometry: Peak, tiotropium (SPIRIVA HANDIHALER) 18 MCG inhalation capsule, CANCELED: Spirometry with graph  - Suspected  mild COPD. Trial of Spiriva daily, and albuterol only as needed. May also have some psychological component as notices more when anxious. Continue routine follow-up with psychiatry. Recheck 3 months.  Cold intolerance  -Long-standing, and once present with normal TSH prior. Discussed possible TSH with next blood work, RTC precautions if worsening symptoms  Abnormal CXR - Plan: tiotropium (SPIRIVA HANDIHALER) 18 MCG inhalation capsule, CANCELED: Spirometry with graph Tobacco abuse - Plan: EKG 12-Lead, Encounter for screening for lung cancer - Plan: CT CHEST LUNG CA SCREEN LOW DOSE W/O CM  - Suspected COPD picture as above. Trial of Spiriva. Discussed screening for lung cancer, will look into options for CT low-dose lung cancer screening. Can assist with tobacco cessation if needed.  Meds ordered this encounter  Medications  . tiotropium (SPIRIVA HANDIHALER) 18 MCG inhalation capsule    Sig: Place 1 capsule (18 mcg total) into inhaler and inhale daily.    Dispense:  30  capsule    Refill:  5   Patient Instructions    We can check thyroid test next time we draw bloodwork if you would like, but it was normal when checked in 2016.   Shortness of breath episodes may be due to anxiety, so would discuss that with your psychiatric care provider. However you do have some signs of COPD on your chest x-ray. Can try Spiriva 1 puff once per day to see if that lessens the episodes of shortness of breath. You can also use albuterol if needed for episodes of shortness of breath or wheezing. Recheck in the next 3 months to see how that is going.   I will order the ct scan for lung cancer screening.    IF you received an x-ray today, you will receive an invoice from [facility]. Please contact [facility] at [phone] with questions or concerns regarding your invoice.   IF you received labwork today, you will receive an invoice from [place]. Please contact [facility] at [phone] with  questions or concerns regarding your invoice.   Our billing staff will not be able to assist you with questions regarding bills from these companies.  You will be contacted with the lab results as soon as they are available. The fastest way to get your results is to activate your My Chart account. Instructions are located on the last page of this paperwork. If you have not heard from [place] regarding the results in 2 weeks, please contact this office.       I personally performed the services described in this documentation, which was scribed in my presence. The recorded information has been reviewed and considered for accuracy and completeness, addended by me as needed, and agree with information above.  Signed,   [name], MD Primary Care at [place].  03/11/17 2:24 PM

## 2017-03-12 ENCOUNTER — Other Ambulatory Visit: Payer: Self-pay | Admitting: Physician Assistant

## 2017-03-12 DIAGNOSIS — J309 Allergic rhinitis, unspecified: Secondary | ICD-10-CM

## 2017-03-26 ENCOUNTER — Ambulatory Visit
Admission: RE | Admit: 2017-03-26 | Discharge: 2017-03-26 | Disposition: A | Discharge status: Home / Self Care | Payer: BLUE CROSS/BLUE SHIELD | Source: Ambulatory Visit | Attending: Family Medicine | Admitting: Family Medicine

## 2017-03-26 ENCOUNTER — Telehealth: Payer: Self-pay | Admitting: Family Medicine

## 2017-03-26 DIAGNOSIS — N6489 Other specified disorders of breast: Secondary | ICD-10-CM

## 2017-03-26 NOTE — Telephone Encounter (Signed)
Pt following up on auth for CT Chest Scan. Auth was denied and msg was sent to provider for peer to peer review. Pt wanted to know the status of this. Pt callback number is [phone].

## 2017-03-26 NOTE — Telephone Encounter (Signed)
Please see prior message with peer to peer call info

## 2017-05-11 ENCOUNTER — Encounter: Payer: Self-pay | Admitting: Urgent Care

## 2017-05-11 ENCOUNTER — Ambulatory Visit (INDEPENDENT_AMBULATORY_CARE_PROVIDER_SITE_OTHER): Payer: BLUE CROSS/BLUE SHIELD | Admitting: Urgent Care

## 2017-05-11 ENCOUNTER — Ambulatory Visit (INDEPENDENT_AMBULATORY_CARE_PROVIDER_SITE_OTHER): Payer: BLUE CROSS/BLUE SHIELD

## 2017-05-11 VITALS — BP 118/74 | HR 90 | Temp 98.1°F | Resp 16 | Ht 61.5 in | Wt 102.4 lb

## 2017-05-11 DIAGNOSIS — M25471 Effusion, right ankle: Secondary | ICD-10-CM | POA: Diagnosis not present

## 2017-05-11 DIAGNOSIS — S99911A Unspecified injury of right ankle, initial encounter: Secondary | ICD-10-CM | POA: Diagnosis not present

## 2017-05-11 DIAGNOSIS — M25571 Pain in right ankle and joints of right foot: Secondary | ICD-10-CM

## 2017-05-11 DIAGNOSIS — S93401A Sprain of unspecified ligament of right ankle, initial encounter: Secondary | ICD-10-CM | POA: Diagnosis not present

## 2017-05-11 DIAGNOSIS — Z8781 Personal history of (healed) traumatic fracture: Secondary | ICD-10-CM | POA: Diagnosis not present

## 2017-05-11 NOTE — Progress Notes (Signed)
  MRN: [id number] DOB: 19-Jun-1951  Subjective:   Sheryl Garrett is a 66 y.o. female presenting for chief complaint of Ankle Injury (per patient, twisted R ankle a few days ago, swollen and painful)  Reports 3 day history of suffering a right ankle injury. Patient stepped off her cough, rolled her right ankle. She has since had right ankle pain, swelling. She has previously fracture said ankle. Has not been resting but has used Ace wrap occasionally, ibuprofen as well with some relief.   [name] has a current medication list which includes the following prescription(s): alprazolam, fluticasone, omeprazole, sertraline, tiotropium, and albuterol. Also has No Known Allergies. Sheryl Garrett  has a past medical history of GERD (gastroesophageal reflux disease) and Hypertension. Also  has a past surgical history that includes Eye surgery.  Objective:   Vitals: BP 118/74 (BP Location: Right Arm, Patient Position: Sitting, Cuff Size: Normal)   Pulse 90   Temp 98.1 F (36.7 C) (Oral)   Resp 16   Ht 5' 1.5" (1.562 m)   Wt 102 lb 6.4 oz (46.4 kg)   BMI 19.03 kg/m   Physical Exam  Constitutional: She is oriented to person, place, and time. She appears well-developed and well-nourished.  Cardiovascular: Normal rate.   Pulmonary/Chest: Effort normal.  Musculoskeletal:       Right ankle: She exhibits decreased range of motion and swelling (over head of the 5th metatarsal). She exhibits no ecchymosis, no deformity and no laceration. Tenderness. Head of 5th metatarsal tenderness found. No lateral malleolus, no medial malleolus, no AITFL, no CF ligament, no posterior TFL and no proximal fibula tenderness found. Achilles tendon exhibits no pain and no defect.       Right foot: Decreased range of motion: inversion, eversion.  Neurological: She is alert and oriented to person, place, and time.   Dg Ankle Complete Right  Result Date: 05/11/2017 CLINICAL DATA:  Pain and swelling following twisting injury 2 days prior EXAM:  RIGHT ANKLE - COMPLETE 3+ VIEW COMPARISON:  None. FINDINGS: Frontal, oblique, and lateral views were obtained. There is mild soft tissue swelling. There is no fracture or joint effusion. Ankle mortise appears intact. There is joint space narrowing medially. No erosive change. IMPRESSION: Joint space narrowing medially. Mild swelling. No evident fracture. Ankle mortise appears intact. Electronically Signed   By: Lowella Grip III M.D.   On: 05/11/2017 11:50    Assessment and Plan :   1. Sprain of right ankle, unspecified ligament, initial encounter 2. Injury of right ankle, initial encounter 3. Acute right ankle pain 4. Right ankle swelling 5. History of ankle fracture - Will manage conservatively as an ankle sprain. Counseled on management including no weight bearing the way she has been, use of [place] ankle brace, crutches. Patient states she has these at home and will use them. Use APAP and ibuprofen for pain and inflammation. RTC in 1 week if no improvement.  [name], PA-C Primary Care at [place] [phone] 05/11/2017  11:17 AM

## 2017-05-11 NOTE — Patient Instructions (Addendum)
You may take 500mg  Tylenol with ibuprofen 400-600mg  every 6 hours for pain and inflammation associated with your ankle injury. Please use crutches to move around so that you are not bearing weight on your ankle as it is healing.      Ankle Sprain An ankle sprain is a stretch or tear in one of the tough, fiber-like tissues (ligaments) in the ankle. The ligaments in your ankle help to hold the bones of the ankle together. What are the causes? This condition is often caused by stepping on or falling on the outer edge of the foot. What increases the risk? This condition is more likely to develop in people who play sports. What are the signs or symptoms? Symptoms of this condition include:  Pain in your ankle.  Swelling.  Bruising. Bruising may develop right after you sprain your ankle or 1-2 days later.  Trouble standing or walking, especially when you turn or change directions.  How is this diagnosed? This condition is diagnosed with a physical exam. During the exam, your health care provider will press on certain parts of your foot and ankle and try to move them in certain ways. X-rays may be taken to see how severe the sprain is and to check for broken bones. How is this treated? This condition may be treated with:  A brace. This is used to keep the ankle from moving until it heals.  An elastic bandage. This is used to support the ankle.  Crutches.  Pain medicine.  Surgery. This may be needed if the sprain is severe.  Physical therapy. This may help to improve the range of motion in the ankle.  Follow these instructions at home:  Rest your ankle.  Take over-the-counter and prescription medicines only as told by your health care provider.  For 2-3 days, keep your ankle raised (elevated) above the level of your heart as much as possible.  If directed, apply ice to the area: ? Put ice in a plastic bag. ? Place a towel between your skin and the bag. ? Leave the ice on for  20 minutes, 2-3 times a day.  If you were given a brace: ? Wear it as directed. ? Remove it to shower or bathe. ? Try not to move your ankle much, but wiggle your toes from time to time. This helps to prevent swelling.  If you were given an elastic bandage (dressing): ? Remove it to shower or bathe. ? Try not to move your ankle much, but wiggle your toes from time to time. This helps to prevent swelling. ? Adjust the dressing to make it more comfortable if it feels too tight. ? Loosen the dressing if you have numbness or tingling in your foot, or if your foot becomes cold and blue.  If you have crutches, use them as told by your health care provider. Continue to use them until you can walk without feeling pain in your ankle. Contact a health care provider if:  You have rapidly increasing bruising or swelling.  Your pain is not relieved with medicine. Get help right away if:  Your toes or foot becomes numb or blue.  You have severe pain that gets worse. This information is not intended to replace advice given to you by your health care provider. Make sure you discuss any questions you have with your health care provider. Document Released: 11/27/2005 Document Revised: 04/05/2016 Document Reviewed: 06/29/2015 Elsevier Interactive Patient Education  2017 [place]  for Routine Care of Injuries Theroutine careofmanyinjuriesincludes rest, ice, compression, and elevation (RICE therapy). RICE therapy is often recommended for injuries to soft tissues, such as a muscle strain, ligament injuries, bruises, and overuse injuries. It can also be used for some bony injuries. Using RICE therapy can help to relieve pain, lessen swelling, and enable your body to heal. Rest Rest is required to allow your body to heal. This usually involves reducing your normal activities and avoiding use of the injured part of your body. Generally, you can return to your normal activities when you  are comfortable and have been given permission by your health care provider. Ice  Icing your injury helps to keep the swelling down, and it lessens pain. Do not apply ice directly to your skin.  Put ice in a plastic bag.  Place a towel between your skin and the bag.  Leave the ice on for 20 minutes, 2-3 times a day.  Do this for as long as you are directed by your health care provider. Compression Compression means putting pressure on the injured area. Compression helps to keep swelling down, gives support, and helps with discomfort. Compression may be done with an elastic bandage. If an elastic bandage has been applied, follow these general tips:  Remove and reapply the bandage every 3-4 hours or as directed by your health care provider.  Make sure the bandage is not wrapped too tightly, because this can cut off circulation. If part of your body beyond the bandage becomes blue, numb, cold, swollen, or more painful, your bandage is most likely too tight. If this occurs, remove your bandage and reapply it more loosely.  See your health care provider if the bandage seems to be making your problems worse rather than better.  Elevation  Elevation means keeping the injured area raised. This helps to lessen swelling and decrease pain. If possible, your injured area should be elevated at or above the level of your heart or the center of your chest. When should I seek medical care?  If your pain and swelling continue.  If your symptoms are getting worse rather than improving. These symptoms may indicate that further evaluation or further X-rays are needed. Sometimes, X-rays may not show a small broken bone (fracture) until a number of days later. Make a follow-up appointment with your health care provider. When should I seek immediate medical care?  If you have sudden severe pain at or below the area of your injury.  If you have redness or increased swelling around your injury.  If you  have tingling or numbness at or below the area of your injury that does not improve after you remove the elastic bandage. This information is not intended to replace advice given to you by your health care provider. Make sure you discuss any questions you have with your health care provider. Document Released: 03/11/2001 Document Revised: 05/02/2016 Document Reviewed: 11/04/2014 Elsevier Interactive Patient Education  2017 [employer].     IF you received an x-ray today, you will receive an invoice from [facility]. Please contact [facility] Radiology at [phone] with questions or concerns regarding your invoice.   IF you received labwork today, you will receive an invoice from [place]. Please contact [facility] at [phone] with questions or concerns regarding your invoice.   Our billing staff will not be able to assist you with questions regarding bills from these companies.  You will be contacted with the lab results as soon as they are available. The  fastest way to get your results is to activate your My Chart account. Instructions are located on the last page of this paperwork. If you have not heard from [place] regarding the results in 2 weeks, please contact this office.

## 2017-06-07 ENCOUNTER — Encounter: Payer: Self-pay | Admitting: Family Medicine

## 2017-06-07 ENCOUNTER — Ambulatory Visit (INDEPENDENT_AMBULATORY_CARE_PROVIDER_SITE_OTHER): Payer: BLUE CROSS/BLUE SHIELD

## 2017-06-07 ENCOUNTER — Ambulatory Visit (INDEPENDENT_AMBULATORY_CARE_PROVIDER_SITE_OTHER): Payer: BLUE CROSS/BLUE SHIELD | Admitting: Family Medicine

## 2017-06-07 VITALS — BP 117/69 | HR 60 | Temp 98.0°F | Resp 18 | Ht 61.42 in | Wt 108.0 lb

## 2017-06-07 DIAGNOSIS — Z720 Tobacco use: Secondary | ICD-10-CM | POA: Diagnosis not present

## 2017-06-07 DIAGNOSIS — R0600 Dyspnea, unspecified: Secondary | ICD-10-CM

## 2017-06-07 DIAGNOSIS — Z122 Encounter for screening for malignant neoplasm of respiratory organs: Secondary | ICD-10-CM

## 2017-06-07 DIAGNOSIS — F17210 Nicotine dependence, cigarettes, uncomplicated: Secondary | ICD-10-CM | POA: Diagnosis not present

## 2017-06-07 DIAGNOSIS — R938 Abnormal findings on diagnostic imaging of other specified body structures: Secondary | ICD-10-CM

## 2017-06-07 DIAGNOSIS — S93401D Sprain of unspecified ligament of right ankle, subsequent encounter: Secondary | ICD-10-CM

## 2017-06-07 DIAGNOSIS — M79671 Pain in right foot: Secondary | ICD-10-CM

## 2017-06-07 DIAGNOSIS — R9389 Abnormal findings on diagnostic imaging of other specified body structures: Secondary | ICD-10-CM

## 2017-06-07 DIAGNOSIS — R062 Wheezing: Secondary | ICD-10-CM

## 2017-06-07 MED ORDER — ALBUTEROL SULFATE HFA 108 (90 BASE) MCG/ACT IN AERS: 1.0000 | INHALATION_SPRAY | Freq: Four times a day (QID) | RESPIRATORY_TRACT | 0 refills | Status: AC | PRN

## 2017-06-07 MED ORDER — TIOTROPIUM BROMIDE MONOHYDRATE 18 MCG IN CAPS: 18.0000 ug | ORAL_CAPSULE | Freq: Every day | RESPIRATORY_TRACT | 5 refills | Status: AC

## 2017-06-07 NOTE — Progress Notes (Addendum)
Subjective:  By signing my name below, I, Sheryl Garrett, attest that this documentation has been prepared under the direction and in the presence of Sheryl Ray, MD. Electronically Signed: Moises Garrett, [place]. 06/07/2017 , 11:22 AM .  Patient was seen in Room 25 .   Patient ID: Sheryl Garrett, female    DOB: Aug 26, 1951, 66 y.o.   MRN: [id number] Chief Complaint  Patient presents with  . Shortness of Breath    3 month follow up  . Foot Pain    right foot swelling for 1 month   HPI Sheryl Garrett is a 66 y.o. female Here for follow up on dyspnea and foot pain/swelling.   She briefly mentioned having some diarrhea last night after eating a large carton of milk duds. She denies any nausea, diarrhea or vomiting today. She also notes insurance switching to [employer] in Oct this year.   Dyspnea Her last visit with me was on Mar 29th for dyspnea, thought part due to anxiety. She is followed by [facility]. She had an exercise treadmill stress test in Jan 2016, 4.78 METs, and reduced aerobic tolerance. She had chest xray done in Jan 2018 with findings of COPD. She continued to still smoke 10 cigarettes a day at last visit, since she was a teenager. We tried her on Spiriva 1 puff once a day, and albuterol if needed for episodic wheezing. Discussed low dose CT for cancer screening, peer-to-peer review was required. Plan on reordering today with peer-to-peer review if needed.   She believes her shortness of breath are more environmental. She started feeling worse when someone nearby was mowing the lawn and she had to go back into her house for relief. She hasn't been taking her Spiriva every day, only once every few days. She does feel better compared to Mar 29th visit. She doesn't have an albuterol inhaler. She denies any wheezing. She denies chronic cough.   Allergies She takes flonase for her seasonal allergies.   Tobacco Abuse She's been smoking less, at 5 cigarettes a day, down from 10  a day.   Right foot pain Patient reports suffering a right ankle injury when trying to wear her flip flops but rolled her ankle on May 30th. She wasn't able to put any weight on it initially. She came in to be seen on June 1st, by [name], PA-C. She was given a Sweedo ankle brace. She wore it about a week, continued to ice the area and elevate her foot. She states her foot still feels stiff in the morning and still feeling sore. She informs that it's better, but not 100%. She denies taking pain medications for it.   Patient Active Problem List   Diagnosis Date Noted  . 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 GERD (gastroesophageal reflux disease) 05/25/2015  . Colon adenomas 05/25/2015  . Diverticulosis of colon without hemorrhage 05/25/2015  . Barrett esophagus 03/08/2015  . Tobacco use disorder 03/08/2015  . Essential hypertension 03/08/2015  . Grief 03/08/2015   Past Medical History:  Diagnosis Date  . GERD (gastroesophageal reflux disease)   . Hypertension    Past Surgical History:  Procedure Laterality Date  . EYE SURGERY     eyelid turned inward on right eye   No Known Allergies Prior to Admission medications   Medication Sig Start Date End Date Taking? Authorizing Provider  albuterol (PROVENTIL HFA;VENTOLIN HFA) 108 (90 Base) MCG/ACT inhaler Inhale 2 puffs into the lungs every 6 (six) hours as needed for wheezing or  shortness of breath. 01/05/17  Yes Wendie Agreste, MD  ALPRAZolam Duanne Moron) 1 MG tablet Take 1 mg by mouth 4 (four) times daily as needed for anxiety.   Yes [provider]  fluticasone (FLONASE) 50 MCG/ACT nasal spray PLACE 2 SPRAYS INTO BOTH NOSTRILS DAILY. 03/12/17  Yes Wendie Agreste, MD  omeprazole (PRILOSEC) 40 MG capsule Take 1 capsule (40 mg total) by mouth daily. 03/08/15  Yes McVeigh, Sherren Mocha, PA  sertraline (ZOLOFT) 100 MG tablet Take 100 mg by mouth daily.   Yes [provider]  tiotropium (SPIRIVA HANDIHALER) 18 MCG inhalation capsule Place 1  capsule (18 mcg total) into inhaler and inhale daily. 03/08/17  Yes Wendie Agreste, MD   Social History   Social History  . Marital status: Widowed    Spouse name: N/A  . Number of children: N/A  . Years of education: N/A   Occupational History  . Not on file.   Social History Main Topics  . Smoking status: Current Every Day Smoker    Packs/day: 1.00    Years: 45.00  . Smokeless tobacco: Never Used  . Alcohol use No  . Drug use: No  . Sexual activity: No   Other Topics Concern  . Not on file   Social History Narrative  . No narrative on file   Review of Systems  Constitutional: Negative for chills, fatigue, fever and unexpected weight change.  Respiratory: Positive for shortness of breath. Negative for cough.   Gastrointestinal: Negative for constipation, diarrhea, nausea and vomiting.  Musculoskeletal: Positive for joint swelling.  Skin: Negative for rash and wound.  Neurological: Negative for dizziness, weakness and headaches.       Objective:   Physical Exam  Constitutional: She is oriented to person, place, and time. She appears well-developed and well-nourished. No distress.  HENT:  Head: Normocephalic and atraumatic.  Eyes: EOM are normal. Pupils are equal, round, and reactive to light.  Neck: Neck supple.  Cardiovascular: Normal rate.   Pulmonary/Chest: Effort normal and breath sounds normal. No respiratory distress.  Musculoskeletal: Normal range of motion.  Right foot: slight soft tissue swelling over the proximal lateral foot, malleoli non tender, locates pain into metatarsal with movement of her ankle, mild discomfort with palpation of 5th metatarsal, negative drawer, negative talar tilt  Neurological: She is alert and oriented to person, place, and time.  Skin: Skin is warm and dry.  Psychiatric: She has a normal mood and affect. Her behavior is normal.  Nursing note and vitals reviewed.   Vitals:   06/07/17 1029  BP: 117/69  Pulse: 60  Resp:  18  Temp: 98 F (36.7 C)  TempSrc: Oral  SpO2: 93%  Weight: 108 lb (49 kg)  Height: 5' 1.42" (1.56 m)  Dg Ankle Complete Right  Result Date: 05/11/2017 CLINICAL DATA:  Pain and swelling following twisting injury 2 days prior EXAM: RIGHT ANKLE - COMPLETE 3+ VIEW COMPARISON:  None. FINDINGS: Frontal, oblique, and lateral views were obtained. There is mild soft tissue swelling. There is no fracture or joint effusion. Ankle mortise appears intact. There is joint space narrowing medially. No erosive change. IMPRESSION: Joint space narrowing medially. Mild swelling. No evident fracture. Ankle mortise appears intact. Electronically Signed   By: Lowella Grip III M.D.   On: 05/11/2017 11:50   Dg Foot Complete Right  Result Date: 06/07/2017 CLINICAL DATA:  Right ankle sprain 05/09/2017. Persistent fifth metatarsal pain. EXAM: RIGHT FOOT COMPLETE - 3+ VIEW COMPARISON:  None. FINDINGS:  Fracture noted through the base of the right fifth metatarsal, minimally displaced. Sclerosis noted throughout much of the proximal third of the right fifth metatarsal, possibly related to healing although the fracture only involves the proximal tip of the fifth metatarsal. No additional fracture, subluxation or dislocation. IMPRESSION: Avulsion fracture at the tip of the proximal right fifth metatarsal, minimally displaced. Electronically Signed   By: Rolm Baptise M.D.   On: 06/07/2017 11:40       Assessment & Plan:    Jamieka Royle is a 66 y.o. female Tobacco abuse - Plan: CT CHEST LUNG CA SCREEN LOW DOSE W/O CM Wheezing - Plan: albuterol (PROVENTIL HFA;VENTOLIN HFA) 108 (90 Base) MCG/ACT inhaler Dyspnea, unspecified type - Plan: tiotropium (SPIRIVA HANDIHALER) 18 MCG inhalation capsule Abnormal CXR - Plan: tiotropium (SPIRIVA HANDIHALER) 18 MCG inhalation capsule  - Likely COPD, improved. May have component of allergies as well as anxiety related. Recommended screen the daily, albuterol as needed, recheck in the next  few weeks  - Tobacco cessation discussed over 3 minutes with potential resources, and options such as Chantix discussed. With her anxiety symptoms would recommend discussing Chantix with her psychiatrist first. Commended on cutting back on smoking.  Encounter for screening for lung cancer - Plan: CT CHEST LUNG CA SCREEN LOW DOSE W/O CM  - Attempted to order low-dose CT scan again, may need prior authorization.  Sprain of right ankle, unspecified ligament, subsequent encounter Right foot pain - Plan: DG Foot Complete Right  - Fifth metatarsal styloid avulsion seen on x-Garrett. Based on timing of injury, can continue splint, hard soled shoe or transition to walking boot if needed. She plans on continuing splint and clog/hard shoe and recheck in 2 weeks.  Meds ordered this encounter  Medications  . albuterol (PROVENTIL HFA;VENTOLIN HFA) 108 (90 Base) MCG/ACT inhaler    Sig: Inhale 1 puff into the lungs every 6 (six) hours as needed for wheezing or shortness of breath.    Dispense:  1 Inhaler    Refill:  0  . tiotropium (SPIRIVA HANDIHALER) 18 MCG inhalation capsule    Sig: Place 1 capsule (18 mcg total) into inhaler and inhale daily.    Dispense:  30 capsule    Refill:  5   Patient Instructions   If breathing seems to be worse with allergies, can try Claritin over the counter. Albuterol if needed for wheezing.  I would recommend using the Spiriva once per day to see if that will continue helping your shortness of breath.   Great work on cutting back on smoking. Continue to work toward no smoking. Let me know if you need help in quitting.   [place] offers smoking cessation clinics. Registration is reqired. To register call [phone] or register online at [email or web address] Chantix may also be an option, but can discuss this with your psychiatrist.   I will check an x-Garrett of your foot bones today, but if those are normal, start with range of motion and gentle exercises below for ankle  sprain. If that pain is not improving in the next 2 or at most 3 weeks, return for recheck. Sooner if worse.    Ankle Sprain, Phase I Rehab Ask your health care provider which exercises are safe for you. Do exercises exactly as told by your health care provider and adjust them as directed. It is normal to feel mild stretching, pulling, tightness, or discomfort as you do these exercises, but you should stop right away if you feel sudden pain  or your pain gets worse.Do not begin these exercises until told by your health care provider. Stretching and range of motion exercises These exercises warm up your muscles and joints and improve the movement and flexibility of your lower leg and ankle. These exercises also help to relieve pain and stiffness. Exercise A: Gastroc and soleus stretch  1. Sit on the floor with your left / right leg extended. 2. Loop a belt or towel around the ball of your left / right foot. The ball of your foot is on the walking surface, right under your toes. 3. Keep your left / right ankle and foot relaxed and keep your knee straight while you use the belt or towel to pull your foot toward you. You should feel a gentle stretch behind your calf or knee. 4. Hold this position for __________ seconds, then release to the starting position. Repeat the exercise with your knee bent. You can put a pillow or a rolled bath towel under your knee to support it. You should feel a stretch deep in your calf or at your Achilles tendon. Repeat each stretch __________ times. Complete these stretches __________ times a day. Exercise B: Ankle alphabet  1. Sit with your left / right leg supported at the lower leg. ? Do not rest your foot on anything. ? Make sure your foot has room to move freely. 2. Think of your left / right foot as a paintbrush, and move your foot to trace each letter of the alphabet in the air. Keep your hip and knee still while you trace. Make the letters as large as you can  without feeling discomfort. 3. Trace every letter from A to Z. Repeat __________ times. Complete this exercise __________ times a day. Strengthening exercises These exercises build strength and endurance in your ankle and lower leg. Endurance is the ability to use your muscles for a long time, even after they get tired. Exercise C: Dorsiflexors  1. Secure a rubber exercise band or tube to an object, such as a table leg, that will stay still when the band is pulled. Secure the other end around your left / right foot. 2. Sit on the floor facing the object, with your left / right leg extended. The band or tube should be slightly tense when your foot is relaxed. 3. Slowly bring your foot toward you, pulling the band tighter. 4. Hold this position for __________ seconds. 5. Slowly return your foot to the starting position. Repeat __________ times. Complete this exercise __________ times a day. Exercise D: Plantar flexors  1. Sit on the floor with your left / right leg extended. 2. Loop a rubber exercise tube or band around the ball of your left / right foot. The ball of your foot is on the walking surface, right under your toes. ? Hold the ends of the band or tube in your hands. ? The band or tube should be slightly tense when your foot is relaxed. 3. Slowly point your foot and toes downward, pushing them away from you. 4. Hold this position for __________ seconds. 5. Slowly return your foot to the starting position. Repeat __________ times. Complete this exercise __________ times a day. Exercise E: Evertors 1. Sit on the floor with your legs straight out in front of you. 2. Loop a rubber exercise band or tube around the ball of your left / right foot. The ball of your foot is on the walking surface, right under your toes. ? Hold the ends  of the band in your hands, or secure the band to a stable object. ? The band or tube should be slightly tense when your foot is relaxed. 3. Slowly push your  foot outward, away from your other leg. 4. Hold this position for __________ seconds. 5. Slowly return your foot to the starting position. Repeat __________ times. Complete this exercise __________ times a day. This information is not intended to replace advice given to you by your health care provider. Make sure you discuss any questions you have with your health care provider. Document Released: 06/28/2005 Document Revised: 08/03/2016 Document Reviewed: 10/11/2015 Elsevier Interactive Patient Education  2018 [employer].   Steps to Quit Smoking Smoking tobacco can be harmful to your health and can affect almost every organ in your body. Smoking puts you, and those around you, at risk for developing many serious chronic diseases. Quitting smoking is difficult, but it is one of the best things that you can do for your health. It is never too late to quit. What are the benefits of quitting smoking? When you quit smoking, you lower your risk of developing serious diseases and conditions, such as:  Lung cancer or lung disease, such as COPD.  Heart disease.  Stroke.  Heart attack.  Infertility.  Osteoporosis and bone fractures.  Additionally, symptoms such as coughing, wheezing, and shortness of breath may get better when you quit. You may also find that you get sick less often because your body is stronger at fighting off colds and infections. If you are pregnant, quitting smoking can help to reduce your chances of having a baby of low birth weight. How do I get ready to quit? When you decide to quit smoking, create a plan to make sure that you are successful. Before you quit:  Pick a date to quit. Set a date within the next two weeks to give you time to prepare.  Write down the reasons why you are quitting. Keep this list in places where you will see it often, such as on your bathroom mirror or in your car or wallet.  Identify the people, places, things, and activities that make you  want to smoke (triggers) and avoid them. Make sure to take these actions: ? Throw away all cigarettes at home, at work, and in your car. ? Throw away smoking accessories, such as Scientist, research (medical). ? Clean your car and make sure to empty the ashtray. ? Clean your home, including curtains and carpets.  Tell your family, friends, and coworkers that you are quitting. Support from your loved ones can make quitting easier.  Talk with your health care provider about your options for quitting smoking.  Find out what treatment options are covered by your health insurance.  What strategies can I use to quit smoking? Talk with your healthcare provider about different strategies to quit smoking. Some strategies include:  Quitting smoking altogether instead of gradually lessening how much you smoke over a period of time. Research shows that quitting "cold [place]" is more successful than gradually quitting.  Attending in-person counseling to help you build problem-solving skills. You are more likely to have success in quitting if you attend several counseling sessions. Even short sessions of 10 minutes can be effective.  Finding resources and support systems that can help you to quit smoking and remain smoke-free after you quit. These resources are most helpful when you use them often. They can include: ? Online chats with a Social worker. ? Telephone quitlines. ? Printed  self-help materials. ? Support groups or group counseling. ? Text messaging programs. ? Mobile phone applications.  Taking medicines to help you quit smoking. (If you are pregnant or breastfeeding, talk with your health care provider first.) Some medicines contain nicotine and some do not. Both types of medicines help with cravings, but the medicines that include nicotine help to relieve withdrawal symptoms. Your health care provider may recommend: ? Nicotine patches, gum, or lozenges. ? Nicotine inhalers or sprays. ? Non-nicotine  medicine that is taken by mouth.  Talk with your health care provider about combining strategies, such as taking medicines while you are also receiving in-person counseling. Using these two strategies together makes you more likely to succeed in quitting than if you used either strategy on its own. If you are pregnant or breastfeeding, talk with your health care provider about finding counseling or other support strategies to quit smoking. Do not take medicine to help you quit smoking unless told to do so by your health care provider. What things can I do to make it easier to quit? Quitting smoking might feel overwhelming at first, but there is a lot that you can do to make it easier. Take these important actions:  Reach out to your family and friends and ask that they support and encourage you during this time. Call telephone quitlines, reach out to support groups, or work with a counselor for support.  Ask people who smoke to avoid smoking around you.  Avoid places that trigger you to smoke, such as bars, parties, or smoke-break areas at work.  Spend time around people who do not smoke.  Lessen stress in your life, because stress can be a smoking trigger for some people. To lessen stress, try: ? Exercising regularly. ? Deep-breathing exercises. ? Yoga. ? Meditating. ? Performing a body scan. This involves closing your eyes, scanning your body from head to toe, and noticing which parts of your body are particularly tense. Purposefully relax the muscles in those areas.  Download or purchase mobile phone or tablet apps (applications) that can help you stick to your quit plan by providing reminders, tips, and encouragement. There are many free apps, such as QuitGuide from the [employer] Office manager for Disease Control and Prevention). You can find other support for quitting smoking (smoking cessation) through smokefree.gov and other websites.  How will I feel when I quit smoking? Within the first 24  hours of quitting smoking, you may start to feel some withdrawal symptoms. These symptoms are usually most noticeable 2-3 days after quitting, but they usually do not last beyond 2-3 weeks. Changes or symptoms that you might experience include:  Mood swings.  Restlessness, anxiety, or irritation.  Difficulty concentrating.  Dizziness.  Strong cravings for sugary foods in addition to nicotine.  Mild weight gain.  Constipation.  Nausea.  Coughing or a sore throat.  Changes in how your medicines work in your body.  A depressed mood.  Difficulty sleeping (insomnia).  After the first 2-3 weeks of quitting, you may start to notice more positive results, such as:  Improved sense of smell and taste.  Decreased coughing and sore throat.  Slower heart rate.  Lower Garrett pressure.  Clearer skin.  The ability to breathe more easily.  Fewer sick days.  Quitting smoking is very challenging for most people. Do not get discouraged if you are not successful the first time. Some people need to make many attempts to quit before they achieve long-term success. Do your best to stick  to your quit plan, and talk with your health care provider if you have any questions or concerns. This information is not intended to replace advice given to you by your health care provider. Make sure you discuss any questions you have with your health care provider. Document Released: 11/21/2001 Document Revised: 07/25/2016 Document Reviewed: 04/13/2015 Elsevier Interactive Patient Education  2017 [employer].      IF you received an x-Garrett today, you will receive an invoice from [facility]. Please contact [facility] at [phone] with questions or concerns regarding your invoice.   IF you received labwork today, you will receive an invoice from [place] [facility]. Please contact [facility] at [phone] with questions or concerns regarding your invoice.   Our billing staff will not be  able to assist you with questions regarding bills from these companies.  You will be contacted with the lab results as soon as they are available. The fastest way to get your results is to activate your My Chart account. Instructions are located on the last page of this paperwork. If you have not heard from [place] regarding the results in 2 weeks, please contact this office.       I personally performed the services described in this documentation, which was scribed in my presence. The recorded information has been reviewed and considered for accuracy and completeness, addended by me as needed, and agree with information above.  Signed,   Sheryl Ray, MD [facility].  06/07/17 1:49 PM

## 2017-06-07 NOTE — Patient Instructions (Addendum)
If breathing seems to be worse with allergies, can try Claritin over the counter. Albuterol if needed for wheezing.  I would recommend using the Spiriva once per day to see if that will continue helping your shortness of breath.   Great work on cutting back on smoking. Continue to work toward no smoking. Let me know if you need help in quitting.   [facility] offers smoking cessation clinics. Registration is reqired. To register call [phone] or register online at [email or web address] Chantix may also be an option, but can discuss this with your psychiatrist.   I will check an x-ray of your foot bones today, but if those are normal, start with range of motion and gentle exercises below for ankle sprain. If that pain is not improving in the next 2 or at most 3 weeks, return for recheck. Sooner if worse.    Ankle Sprain, Phase I Rehab Ask your health care provider which exercises are safe for you. Do exercises exactly as told by your health care provider and adjust them as directed. It is normal to feel mild stretching, pulling, tightness, or discomfort as you do these exercises, but you should stop right away if you feel sudden pain or your pain gets worse.Do not begin these exercises until told by your health care provider. Stretching and range of motion exercises These exercises warm up your muscles and joints and improve the movement and flexibility of your lower leg and ankle. These exercises also help to relieve pain and stiffness. Exercise A: Gastroc and soleus stretch  1. Sit on the floor with your left / right leg extended. 2. Loop a belt or towel around the ball of your left / right foot. The ball of your foot is on the walking surface, right under your toes. 3. Keep your left / right ankle and foot relaxed and keep your knee straight while you use the belt or towel to pull your foot toward you. You should feel a gentle stretch behind your calf or knee. 4. Hold this position for __________  seconds, then release to the starting position. Repeat the exercise with your knee bent. You can put a pillow or a rolled bath towel under your knee to support it. You should feel a stretch deep in your calf or at your Achilles tendon. Repeat each stretch __________ times. Complete these stretches __________ times a day. Exercise B: Ankle alphabet  1. Sit with your left / right leg supported at the lower leg. ? Do not rest your foot on anything. ? Make sure your foot has room to move freely. 2. Think of your left / right foot as a paintbrush, and move your foot to trace each letter of the alphabet in the air. Keep your hip and knee still while you trace. Make the letters as large as you can without feeling discomfort. 3. Trace every letter from A to Z. Repeat __________ times. Complete this exercise __________ times a day. Strengthening exercises These exercises build strength and endurance in your ankle and lower leg. Endurance is the ability to use your muscles for a long time, even after they get tired. Exercise C: Dorsiflexors  1. Secure a rubber exercise band or tube to an object, such as a table leg, that will stay still when the band is pulled. Secure the other end around your left / right foot. 2. Sit on the floor facing the object, with your left / right leg extended. The band or tube should be slightly  tense when your foot is relaxed. 3. Slowly bring your foot toward you, pulling the band tighter. 4. Hold this position for __________ seconds. 5. Slowly return your foot to the starting position. Repeat __________ times. Complete this exercise __________ times a day. Exercise D: Plantar flexors  1. Sit on the floor with your left / right leg extended. 2. Loop a rubber exercise tube or band around the ball of your left / right foot. The ball of your foot is on the walking surface, right under your toes. ? Hold the ends of the band or tube in your hands. ? The band or tube should be  slightly tense when your foot is relaxed. 3. Slowly point your foot and toes downward, pushing them away from you. 4. Hold this position for __________ seconds. 5. Slowly return your foot to the starting position. Repeat __________ times. Complete this exercise __________ times a day. Exercise E: Evertors 1. Sit on the floor with your legs straight out in front of you. 2. Loop a rubber exercise band or tube around the ball of your left / right foot. The ball of your foot is on the walking surface, right under your toes. ? Hold the ends of the band in your hands, or secure the band to a stable object. ? The band or tube should be slightly tense when your foot is relaxed. 3. Slowly push your foot outward, away from your other leg. 4. Hold this position for __________ seconds. 5. Slowly return your foot to the starting position. Repeat __________ times. Complete this exercise __________ times a day. This information is not intended to replace advice given to you by your health care provider. Make sure you discuss any questions you have with your health care provider. Document Released: 06/28/2005 Document Revised: 08/03/2016 Document Reviewed: 10/11/2015 Elsevier Interactive Patient Education  2018 [employer].   Steps to Quit Smoking Smoking tobacco can be harmful to your health and can affect almost every organ in your body. Smoking puts you, and those around you, at risk for developing many serious chronic diseases. Quitting smoking is difficult, but it is one of the best things that you can do for your health. It is never too late to quit. What are the benefits of quitting smoking? When you quit smoking, you lower your risk of developing serious diseases and conditions, such as:  Lung cancer or lung disease, such as COPD.  Heart disease.  Stroke.  Heart attack.  Infertility.  Osteoporosis and bone fractures.  Additionally, symptoms such as coughing, wheezing, and shortness of  breath may get better when you quit. You may also find that you get sick less often because your body is stronger at fighting off colds and infections. If you are pregnant, quitting smoking can help to reduce your chances of having a baby of low birth weight. How do I get ready to quit? When you decide to quit smoking, create a plan to make sure that you are successful. Before you quit:  Pick a date to quit. Set a date within the next two weeks to give you time to prepare.  Write down the reasons why you are quitting. Keep this list in places where you will see it often, such as on your bathroom mirror or in your car or wallet.  Identify the people, places, things, and activities that make you want to smoke (triggers) and avoid them. Make sure to take these actions: ? Throw away all cigarettes at home, at  work, and in your car. ? Throw away smoking accessories, such as Scientist, research (medical). ? Clean your car and make sure to empty the ashtray. ? Clean your home, including curtains and carpets.  Tell your family, friends, and coworkers that you are quitting. Support from your loved ones can make quitting easier.  Talk with your health care provider about your options for quitting smoking.  Find out what treatment options are covered by your health insurance.  What strategies can I use to quit smoking? Talk with your healthcare provider about different strategies to quit smoking. Some strategies include:  Quitting smoking altogether instead of gradually lessening how much you smoke over a period of time. Research shows that quitting "cold [place]" is more successful than gradually quitting.  Attending in-person counseling to help you build problem-solving skills. You are more likely to have success in quitting if you attend several counseling sessions. Even short sessions of 10 minutes can be effective.  Finding resources and support systems that can help you to quit smoking and remain  smoke-free after you quit. These resources are most helpful when you use them often. They can include: ? Online chats with a Social worker. ? Telephone quitlines. ? Careers information officer. ? Support groups or group counseling. ? Text messaging programs. ? Mobile phone applications.  Taking medicines to help you quit smoking. (If you are pregnant or breastfeeding, talk with your health care provider first.) Some medicines contain nicotine and some do not. Both types of medicines help with cravings, but the medicines that include nicotine help to relieve withdrawal symptoms. Your health care provider may recommend: ? Nicotine patches, gum, or lozenges. ? Nicotine inhalers or sprays. ? Non-nicotine medicine that is taken by mouth.  Talk with your health care provider about combining strategies, such as taking medicines while you are also receiving in-person counseling. Using these two strategies together makes you more likely to succeed in quitting than if you used either strategy on its own. If you are pregnant or breastfeeding, talk with your health care provider about finding counseling or other support strategies to quit smoking. Do not take medicine to help you quit smoking unless told to do so by your health care provider. What things can I do to make it easier to quit? Quitting smoking might feel overwhelming at first, but there is a lot that you can do to make it easier. Take these important actions:  Reach out to your family and friends and ask that they support and encourage you during this time. Call telephone quitlines, reach out to support groups, or work with a counselor for support.  Ask people who smoke to avoid smoking around you.  Avoid places that trigger you to smoke, such as bars, parties, or smoke-break areas at work.  Spend time around people who do not smoke.  Lessen stress in your life, because stress can be a smoking trigger for some people. To lessen stress,  try: ? Exercising regularly. ? Deep-breathing exercises. ? Yoga. ? Meditating. ? Performing a body scan. This involves closing your eyes, scanning your body from head to toe, and noticing which parts of your body are particularly tense. Purposefully relax the muscles in those areas.  Download or purchase mobile phone or tablet apps (applications) that can help you stick to your quit plan by providing reminders, tips, and encouragement. There are many free apps, such as QuitGuide from the [employer] Office manager for Disease Control and Prevention). You can find other support for quitting  smoking (smoking cessation) through smokefree.gov and other websites.  How will I feel when I quit smoking? Within the first 24 hours of quitting smoking, you may start to feel some withdrawal symptoms. These symptoms are usually most noticeable 2-3 days after quitting, but they usually do not last beyond 2-3 weeks. Changes or symptoms that you might experience include:  Mood swings.  Restlessness, anxiety, or irritation.  Difficulty concentrating.  Dizziness.  Strong cravings for sugary foods in addition to nicotine.  Mild weight gain.  Constipation.  Nausea.  Coughing or a sore throat.  Changes in how your medicines work in your body.  A depressed mood.  Difficulty sleeping (insomnia).  After the first 2-3 weeks of quitting, you may start to notice more positive results, such as:  Improved sense of smell and taste.  Decreased coughing and sore throat.  Slower heart rate.  Lower blood pressure.  Clearer skin.  The ability to breathe more easily.  Fewer sick days.  Quitting smoking is very challenging for most people. Do not get discouraged if you are not successful the first time. Some people need to make many attempts to quit before they achieve long-term success. Do your best to stick to your quit plan, and talk with your health care provider if you have any questions or concerns. This  information is not intended to replace advice given to you by your health care provider. Make sure you discuss any questions you have with your health care provider. Document Released: 11/21/2001 Document Revised: 07/25/2016 Document Reviewed: 04/13/2015 Elsevier Interactive Patient Education  2017 [employer].      IF you received an x-ray today, you will receive an invoice from [facility]. Please contact [facility] at [phone] with questions or concerns regarding your invoice.   IF you received labwork today, you will receive an invoice from [place]. Please contact [facility] at [phone] with questions or concerns regarding your invoice.   Our billing staff will not be able to assist you with questions regarding bills from these companies.  You will be contacted with the lab results as soon as they are available. The fastest way to get your results is to activate your My Chart account. Instructions are located on the last page of this paperwork. If you have not heard from [place] regarding the results in 2 weeks, please contact this office.

## 2017-06-18 ENCOUNTER — Telehealth: Payer: Self-pay | Admitting: Family Medicine

## 2017-06-18 NOTE — Telephone Encounter (Signed)
Initiated [employer] authorization for CT Chest W/O Contrast ordered for pt. Authorization was not approved and needs medical review with [employer]. The reason it said it was not approved is it states the clinically appropriate exam for a lung-cancer screening is a low-dose chest CT. I saw that the procedure in the referral is for low-dose and used that procedure code, however, authorization was still not approved. Please advise. If authorization is obtained please let me know and I will fax referral to [place].

## 2017-06-18 NOTE — Telephone Encounter (Signed)
FYI

## 2017-06-21 ENCOUNTER — Ambulatory Visit: Payer: BLUE CROSS/BLUE SHIELD | Admitting: Emergency Medicine

## 2017-06-22 ENCOUNTER — Ambulatory Visit (INDEPENDENT_AMBULATORY_CARE_PROVIDER_SITE_OTHER): Payer: BLUE CROSS/BLUE SHIELD | Admitting: Physician Assistant

## 2017-06-22 ENCOUNTER — Ambulatory Visit (INDEPENDENT_AMBULATORY_CARE_PROVIDER_SITE_OTHER): Payer: BLUE CROSS/BLUE SHIELD

## 2017-06-22 ENCOUNTER — Encounter: Payer: Self-pay | Admitting: Physician Assistant

## 2017-06-22 VITALS — BP 109/70 | HR 74 | Temp 97.6°F | Resp 18 | Ht 61.42 in | Wt 108.4 lb

## 2017-06-22 DIAGNOSIS — M79671 Pain in right foot: Secondary | ICD-10-CM | POA: Diagnosis not present

## 2017-06-22 DIAGNOSIS — S92351G Displaced fracture of fifth metatarsal bone, right foot, subsequent encounter for fracture with delayed healing: Secondary | ICD-10-CM | POA: Diagnosis not present

## 2017-06-22 MED ORDER — TRAMADOL-ACETAMINOPHEN 37.5-325 MG PO TABS: 1.0000 | ORAL_TABLET | Freq: Four times a day (QID) | ORAL | 0 refills | Status: AC | PRN

## 2017-06-22 NOTE — Patient Instructions (Addendum)
Your xray indicates that your fracture has delayed healing. Continue wearing the brace and a hard sole shoe. I recommend using the crutches and not wearing weight until you can tolerate bearing weight without significant pain. Continue using ice to the affected area. You should hear from ortho within the next week. If you do not, contact our office. If any of your symptoms worsen, seek care sooner. Thank you for letting me participate in your health and well being.    IF you received an x-ray today, you will receive an invoice from [facility]. Please contact [facility] at [phone] with questions or concerns regarding your invoice.   IF you received labwork today, you will receive an invoice from [place]. Please contact [facility] at [phone] with questions or concerns regarding your invoice.   Our billing staff will not be able to assist you with questions regarding bills from these companies.  You will be contacted with the lab results as soon as they are available. The fastest way to get your results is to activate your My Chart account. Instructions are located on the last page of this paperwork. If you have not heard from [place] regarding the results in 2 weeks, please contact this office.

## 2017-06-22 NOTE — Progress Notes (Signed)
Sheryl Garrett  MRN: [id number] DOB: [birth year]  Subjective:  Sheryl Garrett is a 66 y.o. female seen in office today for a chief complaint of right foot pain. Pt initially seen by PA Mani on 05/11/17 after right ankle injury. Initial plain films of right ankle were negative. Pt treated conservatively and told to return in one week if pain persisted. Pt returned on 06/07/17 with continuing pain and was seen by Dr. Carlota Raspberry.Plain films of right foot on 06/07/17 showed fifth metatarsal styloid avulsion. Pt was instructed to continue hard soled shoe and Sweedo brace and return in 2 weeks for reevaluation. Today, notes the pain is still there and has not improved at all. Notes she has rested the foot. Has worn the Sweedo brace and iced it. Has not tried any medication for pain. Pain is worse at night and when she is bearing weight. Will sometimes have numbness and tingling in the foot. Denies loss of ROM and weakness.   Review of Systems  Per HPI  Patient Active Problem List   Diagnosis Date Noted  . 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 GERD (gastroesophageal reflux disease) 05/25/2015  . Colon adenomas 05/25/2015  . Diverticulosis of colon without hemorrhage 05/25/2015  . Barrett esophagus 03/08/2015  . Tobacco use disorder 03/08/2015  . Essential hypertension 03/08/2015  . Grief 03/08/2015    Current Outpatient Prescriptions on File Prior to Visit  Medication Sig Dispense Refill  . albuterol (PROVENTIL HFA;VENTOLIN HFA) 108 (90 Base) MCG/ACT inhaler Inhale 1 puff into the lungs every 6 (six) hours as needed for wheezing or shortness of breath. 1 Inhaler 0  . ALPRAZolam (XANAX) 1 MG tablet Take 1 mg by mouth 4 (four) times daily as needed for anxiety.    . fluticasone (FLONASE) 50 MCG/ACT nasal spray PLACE 2 SPRAYS INTO BOTH NOSTRILS DAILY. 16 g 1  . omeprazole (PRILOSEC) 40 MG capsule Take 1 capsule (40 mg total) by mouth daily. 30 capsule 2  . sertraline (ZOLOFT) 100 MG tablet Take 100 mg by mouth daily.    Marland Kitchen  tiotropium (SPIRIVA HANDIHALER) 18 MCG inhalation capsule Place 1 capsule (18 mcg total) into inhaler and inhale daily. 30 capsule 5   No current facility-administered medications on file prior to visit.     No Known Allergies    Social History   Social History  . Marital status: Widowed    Spouse name: N/A  . Number of children: N/A  . Years of education: N/A   Occupational History  . Not on file.   Social History Main Topics  . Smoking status: Current Every Day Smoker    Packs/day: 1.00    Years: 45.00  . Smokeless tobacco: Never Used  . Alcohol use No  . Drug use: No  . Sexual activity: No   Other Topics Concern  . Not on file   Social History Narrative  . No narrative on file    Objective:  BP 109/70   Pulse 74   Temp 97.6 F (36.4 C) (Oral)   Resp 18   Ht 5' 1.42" (1.56 m)   Wt 108 lb 6.4 oz (49.2 kg)   SpO2 99%   BMI 20.20 kg/m   Physical Exam  Constitutional: She is oriented to person, place, and time and well-developed, well-nourished, and in no distress.  HENT:  Head: Normocephalic and atraumatic.  Eyes: Conjunctivae are normal.  Neck: Normal range of motion.  Cardiovascular:  Pulses:      Dorsalis pedis pulses are 2+ on  the right side, and 2+ on the left side.       Posterior tibial pulses are 2+ on the right side, and 2+ on the left side.  Pulmonary/Chest: Effort normal.  Musculoskeletal:       Right foot: There is bony tenderness (with palpation of proximal fifth metatarsal ). There is normal range of motion, no swelling and normal capillary refill.       Left foot: Normal.  Neurological: She is alert and oriented to person, place, and time. Gait normal.  Skin: Skin is warm and dry.  Psychiatric: Affect normal.  Vitals reviewed.  Dg Foot Complete Right  Result Date: 06/22/2017 CLINICAL DATA:  Followup of right fifth metatarsal avulsion fracture. EXAM: RIGHT FOOT COMPLETE - 3+ VIEW COMPARISON:  06/07/2017 FINDINGS: Re- demonstration of  transverse fracture at the base of the fifth metatarsal. No interval healing identified. No new injury identified. IMPRESSION: Persistent fracture at the base of the fifth metatarsal. Electronically Signed   By: Abigail Miyamoto M.D.   On: 06/22/2017 09:04     Assessment and Plan :  1. Fracture of base of fifth metatarsal bone of right foot with delayed healing, subsequent encounter Due to delayed healing and continued pain despite conservative tx, will refer to orthopedics for further evaluation. She was also given short CAM walker. Instructed to rest as needed and bear weight as tolerated. Given ultracet prn for pain while awaiting ortho appointment.  - DG Foot Complete Right; Future - traMADol-acetaminophen (ULTRACET) 37.5-325 MG tablet; Take 1 tablet by mouth every 6 (six) hours as needed.  Dispense: 20 tablet; Refill: 0 - Ambulatory referral to Orthopedic Surgery - Apply cam walker  Tenna Delaine, PA-C  [facility] 06/22/2017 1:05 PM

## 2017-06-27 NOTE — Telephone Encounter (Signed)
I was redirected to [phone]. Initially spoke to urse reviewer Vickie, stated this does not qualify for peer to peer, but was in appeals (based on [employer] decision). I was forwarded to appeals line to leave a message only.  Left message to call back at [phone] for furhter details or if I need to speak with someone to get this authorized.   She qualifies for this screening based on USPSTF recommendations for low dose CT for lung cancer screening as she is still smoking, has greater than 30 pack year history of smoking and is between 34 and 74 years old. Additionally her brother just passed earlier this year with lung cancer.   Please advise patient for delay in test as her insurance provider did not cover the test and has sent it to appeals.

## 2017-06-27 NOTE — Telephone Encounter (Signed)
Phone number is [phone]. No case number was provided. For some reason they have not been providing me with those recently. Thanks.

## 2017-06-27 NOTE — Telephone Encounter (Signed)
I am not sure why it was not approved again as I did order low dose CT. Can you provide me the phone number and case number if available for medical review so I can call to try to get it approved? Thanks.

## 2017-06-27 NOTE — Telephone Encounter (Signed)
Pt advised.

## 2017-06-29 NOTE — Telephone Encounter (Signed)
Tiffany from [employer] called in reference to appeal for pt CT. She said the CPT placed on the order was not for a low dose scan but instead the CPT code for low dose is G0297. She said if this is the scan we are wanting, we will have to create a new request to have this authorized. If there are any questions, Jonelle Sidle can be reached at [phone] ext. [phone].

## 2017-06-29 NOTE — Telephone Encounter (Signed)
Previous message noted. I did not see how to order that lung cancer screening CT with that CPT code. Will route message to radiology pool. Please check to see if I have to order this a different way or can call [facility] imaging to see if they can help. Thanks

## 2017-06-29 NOTE — Telephone Encounter (Signed)
FYI

## 2017-07-04 NOTE — Telephone Encounter (Signed)
Please advise 

## 2017-07-05 ENCOUNTER — Ambulatory Visit (INDEPENDENT_AMBULATORY_CARE_PROVIDER_SITE_OTHER): Payer: Self-pay

## 2017-07-05 ENCOUNTER — Ambulatory Visit (INDEPENDENT_AMBULATORY_CARE_PROVIDER_SITE_OTHER): Payer: BLUE CROSS/BLUE SHIELD | Admitting: Orthopedic Surgery

## 2017-07-05 ENCOUNTER — Encounter (INDEPENDENT_AMBULATORY_CARE_PROVIDER_SITE_OTHER): Payer: Self-pay | Admitting: Orthopedic Surgery

## 2017-07-05 VITALS — Ht 61.0 in | Wt 108.0 lb

## 2017-07-05 DIAGNOSIS — M84374S Stress fracture, right foot, sequela: Secondary | ICD-10-CM

## 2017-07-05 NOTE — Progress Notes (Signed)
Office Visit Note   Patient: Sheryl Garrett           Date of Birth: Jan 27, 1951           MRN: [id number] Visit Date: 07/05/2017              Requested by: Wendie Agreste, MD [address] [place], [place] [address] PCP: Wendie Agreste, MD  Chief Complaint  Patient presents with  . Right Foot - Pain    Xray fx base of 5th MT      HPI: Patient is a 66 year old woman who initially injured her foot about 2 months ago. With a rolling injury while she was in flip-flops. She sustained a fracture of the base of fifth metatarsal. Patient is seen for initial evaluation due to persistent pain with ambulation. Patient states she is very frustrated with her recovery process.  Of note patient states she has Barrett's esophagitis and cannot take anti-inflammatories. She states she is also a smoker and is scheduled for a DEXA scan. She is not on any vitamin D supplements.  Assessment & Plan: Visit Diagnoses:  1. Metatarsal stress fracture, right, sequela     Plan: Recommended a new balance walking sneaker as well as over-the-counter orthotics to unload the base of the fifth metatarsal. Discussed smoking cessation vitamin supplements. Patient states she would like to follow-up as needed.  Follow-Up Instructions: Return in about 4 weeks (around 08/02/2017).   Ortho Exam  Patient is alert, oriented, no adenopathy, well-dressed, normal affect, normal respiratory effort. Examination patient has an antalgic gait she has good range of motion of the ankle and subtalar joint she has good pulses. She is tender to palpation over the base of the fifth metatarsal. There is no skin color changes or temperature changes no dystrophic changes. Patient has good subtalar and ankle range of motion.  Imaging: No results found.  Labs: No results found for: HGBA1C, ESRSEDRATE, CRP, LABURIC, REPTSTATUS, GRAMSTAIN, CULT, LABORGA  Orders:  Orders Placed This Encounter  Procedures  . XR Foot Complete Right    No orders of the defined types were placed in this encounter.    Procedures: No procedures performed  Clinical Data: No additional findings.  ROS:  All other systems negative, except as noted in the HPI. Review of Systems  Objective: Vital Signs: Ht 5\' 1"  (1.549 m)   Wt 108 lb (49 kg)   BMI 20.41 kg/m   Specialty Comments:  No specialty comments available.  PMFS History: Patient Active Problem List   Diagnosis Date Noted  . 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 GERD (gastroesophageal reflux disease) 05/25/2015  . Colon adenomas 05/25/2015  . Diverticulosis of colon without hemorrhage 05/25/2015  . Barrett esophagus 03/08/2015  . Tobacco use disorder 03/08/2015  . Essential hypertension 03/08/2015  . Grief 03/08/2015   Past Medical History:  Diagnosis Date  . GERD (gastroesophageal reflux disease)   . Hypertension     Family History  Problem Relation Age of Onset  . Drug abuse Daughter     Past Surgical History:  Procedure Laterality Date  . EYE SURGERY     eyelid turned inward on right eye   Social History   Occupational History  . Not on file.   Social History Main Topics  . Smoking status: Current Every Day Smoker    Packs/day: 1.00    Years: 45.00  . Smokeless tobacco: Never Used  . Alcohol use No  . Drug use: No  . Sexual activity: No

## 2017-07-12 NOTE — Telephone Encounter (Signed)
Spoke with [facility] - Dr. Carlota Raspberry did order CT correctly- Low dose for ca screening G0297. They will call pt and schedule now.

## 2017-09-09 ENCOUNTER — Ambulatory Visit (HOSPITAL_COMMUNITY)
Admission: EM | Admit: 2017-09-09 | Discharge: 2017-09-09 | Disposition: A | Discharge status: Home / Self Care | Payer: BLUE CROSS/BLUE SHIELD | Attending: Emergency Medicine | Admitting: Emergency Medicine

## 2017-09-09 ENCOUNTER — Encounter (HOSPITAL_COMMUNITY): Payer: Self-pay | Admitting: Emergency Medicine

## 2017-09-09 DIAGNOSIS — J069 Acute upper respiratory infection, unspecified: Secondary | ICD-10-CM

## 2017-09-09 DIAGNOSIS — K209 Esophagitis, unspecified without bleeding: Secondary | ICD-10-CM

## 2017-09-09 DIAGNOSIS — H66003 Acute suppurative otitis media without spontaneous rupture of ear drum, bilateral: Secondary | ICD-10-CM | POA: Diagnosis not present

## 2017-09-09 DIAGNOSIS — K2100 Gastro-esophageal reflux disease with esophagitis, without bleeding: Secondary | ICD-10-CM

## 2017-09-09 DIAGNOSIS — J9801 Acute bronchospasm: Secondary | ICD-10-CM | POA: Diagnosis not present

## 2017-09-09 DIAGNOSIS — K2270 Barrett's esophagus without dysplasia: Secondary | ICD-10-CM

## 2017-09-09 DIAGNOSIS — R0982 Postnasal drip: Secondary | ICD-10-CM | POA: Diagnosis not present

## 2017-09-09 MED ORDER — CEFUROXIME AXETIL 250 MG PO TABS
250.0000 mg | ORAL_TABLET | Freq: Two times a day (BID) | ORAL | 0 refills | Status: AC
Start: 2017-09-09 — End: ?

## 2017-09-09 NOTE — ED Triage Notes (Signed)
Pt here for bilateral ear fullness onset 2 days associated w/nasal congestion/drainage  Denies fevers, cough  A&O x4... NAD... Ambulatory

## 2017-09-09 NOTE — Discharge Instructions (Signed)
Start taking Zantac twice a day in addition to your Prilosec. Start taking the Ceftin antibiotic for your ear infections. Pressure to drink plenty of fluids and stay well-hydrated. Below are medications that she can obtain over-the-counter to help with drainage and congestion. Sudafed PE 10 mg every 4 to 6 hours as needed for congestion Allegra or Zyrtec daily as needed for drainage and runny nose. For stronger antihistamine may take Chlor-Trimeton 2 to 4 mg every 4 to 6 hours, may cause drowsiness.S Saline nasal spray used frequently. Drink plenty of fluids and stay well-hydrated. Flonase or Rhinocort nasal spray daily Stop smoking Use her albuterol inhaler 2 puffs every 4 hours as needed for cough and wheeze

## 2017-09-09 NOTE — ED Provider Notes (Signed)
[facility]    CSN: [id number] Arrival date & time: 09/09/17  1208     History   Chief Complaint Chief Complaint  Patient presents with  . Otalgia    HPI Sheryl Garrett is a 66 y.o. female.   66 year old female Has been smoking for decades as an to the urgent care complaining of stuffy nose, PND, congestion in the right ear and exacerbation of her reflux esophagitis. She has a history of Barrett's esophagitis and has an upper GI every 2 years. Denies known fever or chills.      Past Medical History:  Diagnosis Date  . GERD (gastroesophageal reflux disease)   . Hypertension     Patient Active Problem List   Diagnosis Date Noted  . 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 GERD (gastroesophageal reflux disease) 05/25/2015  . Colon adenomas 05/25/2015  . Diverticulosis of colon without hemorrhage 05/25/2015  . Barrett esophagus 03/08/2015  . Tobacco use disorder 03/08/2015  . Essential hypertension 03/08/2015  . Grief 03/08/2015    Past Surgical History:  Procedure Laterality Date  . EYE SURGERY     eyelid turned inward on right eye    OB History    No data available       Home Medications    Prior to Admission medications   Medication Sig Start Date End Date Taking? Authorizing Provider  albuterol (PROVENTIL HFA;VENTOLIN HFA) 108 (90 Base) MCG/ACT inhaler Inhale 1 puff into the lungs every 6 (six) hours as needed for wheezing or shortness of breath. 06/07/17  Yes Wendie Agreste, MD  ALPRAZolam Duanne Moron) 1 MG tablet Take 1 mg by mouth 4 (four) times daily as needed for anxiety.   Yes [provider]  fluticasone (FLONASE) 50 MCG/ACT nasal spray PLACE 2 SPRAYS INTO BOTH NOSTRILS DAILY. 03/12/17  Yes Wendie Agreste, MD  omeprazole (PRILOSEC) 40 MG capsule Take 1 capsule (40 mg total) by mouth daily. 03/08/15  Yes McVeigh, Sherren Mocha, PA  sertraline (ZOLOFT) 100 MG tablet Take 100 mg by mouth daily.   Yes [provider]  tiotropium (SPIRIVA HANDIHALER) 18  MCG inhalation capsule Place 1 capsule (18 mcg total) into inhaler and inhale daily. 06/07/17  Yes Wendie Agreste, MD  traMADol-acetaminophen (ULTRACET) 37.5-325 MG tablet Take 1 tablet by mouth every 6 (six) hours as needed. 06/22/17  Yes Timmothy Euler, [place] D, PA-C  cefUROXime (CEFTIN) 250 MG tablet Take 1 tablet (250 mg total) by mouth 2 (two) times daily with a meal. 09/09/17   Janne Napoleon, NP    Family History Family History  Problem Relation Age of Onset  . Drug abuse Daughter     Social History Social History  Substance Use Topics  . Smoking status: Current Every Day Smoker    Packs/day: 1.00    Years: 45.00  . Smokeless tobacco: Never Used  . Alcohol use No     Allergies   Patient has no known allergies.   Review of Systems Review of Systems  Constitutional: Positive for fatigue. Negative for activity change, appetite change, chills and fever.  HENT: Positive for congestion, ear pain, postnasal drip and rhinorrhea. Negative for facial swelling.   Eyes: Negative.   Respiratory: Positive for cough.   Cardiovascular: Positive for chest pain.       Patient associates the mid to right chest pain to the reflux esophagitis.  Musculoskeletal: Negative for neck pain and neck stiffness.  Skin: Negative for pallor and rash.  Neurological: Negative.   All other systems reviewed  and are negative.    Physical Exam Triage Vital Signs ED Triage Vitals  Enc Vitals Group     BP 09/09/17 1258 137/73     Pulse Rate 09/09/17 1258 79     Resp 09/09/17 1258 20     Temp 09/09/17 1258 98 F (36.7 C)     Temp Source 09/09/17 1258 Oral     SpO2 09/09/17 1258 100 %     Weight --      Height --      Head Circumference --      Peak Flow --      Pain Score 09/09/17 1300 2     Pain Loc --      Pain Edu? --      Excl. in [place]? --    No data found.   Updated Vital Signs BP 137/73 (BP Location: Left Arm)   Pulse 79   Temp 98 F (36.7 C) (Oral)   Resp 20   SpO2 100%   Visual  Acuity Right Eye Distance:   Left Eye Distance:   Bilateral Distance:    Right Eye Near:   Left Eye Near:    Bilateral Near:     Physical Exam  Constitutional: She is oriented to person, place, and time. She appears well-developed and well-nourished. No distress.  HENT:  Bilateral TMs are erythematous, distorted some areas of bulging and loss of light reflex and some identifiable structures.  Oropharynx with thick PND. Only able to see a portion secondary to patient's tongue stuck to the upper mouth.  Eyes: EOM are normal.  Neck: Normal range of motion. Neck supple.  Cardiovascular: Normal rate, regular rhythm and normal heart sounds.   Pulmonary/Chest: Effort normal. No respiratory distress. She has no rales.  Fair air movement. Distant rare faint expiratory wheeze.  Musculoskeletal: Normal range of motion. She exhibits no edema.  Lymphadenopathy:    She has no cervical adenopathy.  Neurological: She is alert and oriented to person, place, and time.  Skin: Skin is warm and dry. No rash noted.  Psychiatric: She has a normal mood and affect.  Nursing note and vitals reviewed.    UC Treatments / Results  Labs (all labs ordered are listed, but only abnormal results are displayed) Labs Reviewed - No data to display  EKG  EKG Interpretation None       Radiology No results found.  Procedures Procedures (including critical care time)  Medications Ordered in UC Medications - No data to display   Initial Impression / Assessment and Plan / UC Course  I have reviewed the triage vital signs and the nursing notes.  Pertinent labs & imaging results that were available during my care of the patient were reviewed by me and considered in my medical decision making (see chart for details).    Start taking Zantac twice a day in addition to your Prilosec. Start taking the Ceftin antibiotic for your ear infections. Pressure to drink plenty of fluids and stay well-hydrated. Below  are medications that she can obtain over-the-counter to help with drainage and congestion. Sudafed PE 10 mg every 4 to 6 hours as needed for congestion Allegra or Zyrtec daily as needed for drainage and runny nose. For stronger antihistamine may take Chlor-Trimeton 2 to 4 mg every 4 to 6 hours, may cause drowsiness.S Saline nasal spray used frequently. Drink plenty of fluids and stay well-hydrated. Flonase or Rhinocort nasal spray daily Stop smoking Use her albuterol inhaler 2 puffs every  4 hours as needed for cough and wheeze     Final Clinical Impressions(s) / UC Diagnoses   Final diagnoses:  Acute suppurative otitis media of both ears without spontaneous rupture of tympanic membranes, recurrence not specified  PND (post-nasal drip)  Viral upper respiratory tract infection  Bronchospasm  Gastroesophageal reflux disease with esophagitis  Barrett's esophagus with esophagitis    New Prescriptions New Prescriptions   CEFUROXIME (CEFTIN) 250 MG TABLET    Take 1 tablet (250 mg total) by mouth 2 (two) times daily with a meal.     Controlled Substance Prescriptions [place] Controlled Substance Registry consulted? Not Applicable   Janne Napoleon, NP 09/09/17 1416

## 2017-09-13 ENCOUNTER — Ambulatory Visit: Payer: BLUE CROSS/BLUE SHIELD | Admitting: Family Medicine

## 2017-11-15 ENCOUNTER — Encounter: Payer: Self-pay | Admitting: Physician Assistant

## 2017-11-15 ENCOUNTER — Ambulatory Visit (INDEPENDENT_AMBULATORY_CARE_PROVIDER_SITE_OTHER): Payer: Medicare Other | Admitting: Physician Assistant

## 2017-11-15 ENCOUNTER — Other Ambulatory Visit: Payer: Self-pay

## 2017-11-15 VITALS — BP 118/84 | HR 78 | Temp 97.7°F | Resp 18 | Ht 61.0 in | Wt 114.0 lb

## 2017-11-15 DIAGNOSIS — R058 Other specified cough: Secondary | ICD-10-CM

## 2017-11-15 DIAGNOSIS — R05 Cough: Secondary | ICD-10-CM

## 2017-11-15 MED ORDER — HYDROCODONE-HOMATROPINE 5-1.5 MG/5ML PO SYRP: 2.5000 mL | ORAL_SOLUTION | Freq: Every day | ORAL | 0 refills | Status: AC

## 2017-11-15 MED ORDER — DOXYCYCLINE HYCLATE 100 MG PO CAPS: 100.0000 mg | ORAL_CAPSULE | Freq: Two times a day (BID) | ORAL | 0 refills | Status: AC

## 2017-11-15 MED ORDER — PREDNISONE 50 MG PO TABS: ORAL_TABLET | ORAL | 0 refills | Status: AC

## 2017-11-15 NOTE — Patient Instructions (Addendum)
Do not mix your cough syrup with Xanax.      IF you received an x-ray today, you will receive an invoice from [facility]. Please contact [facility] at [phone] with questions or concerns regarding your invoice.   IF you received labwork today, you will receive an invoice from [facility]. Please contact [facility] at [phone] with questions or concerns regarding your invoice.   Our billing staff will not be able to assist you with questions regarding bills from these companies.  You will be contacted with the lab results as soon as they are available. The fastest way to get your results is to activate your My Chart account. Instructions are located on the last page of this paperwork. If you have not heard from [place] regarding the results in 2 weeks, please contact this office.

## 2017-11-15 NOTE — Progress Notes (Signed)
    11/15/2017 12:56 PM   DOB: [birth year] / MRN: [id number]  SUBJECTIVE:  Sheryl Garrett is a 66 y.o. female presenting for cough.  She has a 45 pack year history of smoking and previous imaging consistent with COPD. Most recent chest rad in January of this year. She denies SOB today.  Does not feel that she is getting worse or better today. Previous labs reveal no hyperglycemia. History of GERD previously prescribed prilosec.   She has No Known Allergies.   She  has a past medical history of GERD (gastroesophageal reflux disease) and Hypertension.    She  reports that she has been smoking.  She has a 45.00 pack-year smoking history. she has never used smokeless tobacco. She reports that she does not drink alcohol or use drugs. She  reports that she does not engage in sexual activity. The patient  has a past surgical history that includes Eye surgery.  Her family history includes Drug abuse in her daughter.  Review of Systems  Constitutional: Negative for chills and fever.  Gastrointestinal: Negative for nausea.  Skin: Negative for itching and rash.  Neurological: Negative for dizziness.    The problem list and medications were reviewed and updated by myself where necessary and exist elsewhere in the encounter.   OBJECTIVE:  BP 118/84 (BP Location: Left Arm, Patient Position: Sitting, Cuff Size: Normal)   Pulse 78   Temp 97.7 F (36.5 C) (Oral)   Resp 18   Ht 5\' 1"  (1.549 m)   Wt 114 lb (51.7 kg)   SpO2 96%   BMI 21.54 kg/m     Physical Exam  Constitutional: Vital signs are normal. She appears cachectic. She is active.  Non-toxic appearance.  Cardiovascular: Normal rate, regular rhythm, S1 normal, S2 normal, normal heart sounds and intact distal pulses. Exam reveals no gallop, no friction rub and no decreased pulses.  No murmur heard. Pulmonary/Chest: Effort normal and breath sounds normal. No stridor. No tachypnea. No respiratory distress. She has no wheezes. She has no rales.  She exhibits no tenderness.  Abdominal: She exhibits no distension.  Musculoskeletal: She exhibits no edema.  Neurological: She is alert.  Skin: Skin is warm and dry. She is not diaphoretic. No pallor.    No results found for this or any previous visit (from the past 72 hour(s)).  No results found.  ASSESSMENT AND PLAN:  Tyquisha was seen today for cough.  Diagnoses and all orders for this visit:  Post-viral cough syndrome Comments: History of COPD on rads.  No evidence of diabetes per previous labs.  Doxy to cover atypical and prednisone burst.  Orders: -     predniSONE (DELTASONE) 50 MG tablet; Take one tab daily. -     doxycycline (VIBRAMYCIN) 100 MG capsule; Take 1 capsule (100 mg total) by mouth 2 (two) times daily for 10 days. -     HYDROcodone-homatropine (HYCODAN) 5-1.5 MG/5ML syrup; Take 2.5-5 mLs by mouth at bedtime for 5 days.    The patient is advised to call or return to clinic if she does not see an improvement in symptoms, or to seek the care of the closest emergency department if she worsens with the above plan.   Philis Fendt, MHS, PA-C [facility] 11/15/2017 12:56 PM

## 2018-02-18 ENCOUNTER — Telehealth (INDEPENDENT_AMBULATORY_CARE_PROVIDER_SITE_OTHER): Payer: Self-pay | Admitting: Orthopedic Surgery

## 2018-02-18 NOTE — Telephone Encounter (Signed)
Please see message below. Patient also called and left a message stating that she is having "issues with her lower back" since she has been packing to move. Daughter wants a note for her job stating that she will need to help her mother. Patient also wants an appointment- will schedule ov since last seen by you 12/29/16.

## 2018-02-18 NOTE — Telephone Encounter (Signed)
I am not sure if there is anything that I am supposed to do with this. Please let me know.

## 2018-02-18 NOTE — Telephone Encounter (Signed)
OV and note ok, get with me to talk about what it should say

## 2018-02-18 NOTE — Telephone Encounter (Signed)
Dr. Kennon Portela patient, please see previous message.

## 2018-02-18 NOTE — Telephone Encounter (Signed)
Patient called asking if her daughter Jasara Corrigan could get a note stating that she will be coming down from [place] to [place] to help her move to [facility] to [email or web address] and mailed to her home address for her daughters employer. CB # [phone]

## 2018-02-22 NOTE — Telephone Encounter (Signed)
Left message for patient to callback to schedule OV. °

## 2018-03-19 NOTE — Telephone Encounter (Signed)
Will await call from patient.

## 2018-03-20 ENCOUNTER — Encounter: Payer: Self-pay | Admitting: Physician Assistant

## 2018-03-28 LAB — HM COLONOSCOPY

## 2018-04-11 ENCOUNTER — Encounter: Payer: Self-pay | Admitting: *Deleted

## 2020-11-10 DEATH — deceased
